# Patient Record
Sex: Female | Born: 1935 | Race: White | Hispanic: No | Smoking: Never smoker
Health system: Southern US, Community
[De-identification: ages and names within clinical notes are randomized; demographics above are authoritative.]

## PROBLEM LIST (undated history)

## (undated) DIAGNOSIS — C801 Malignant (primary) neoplasm, unspecified: Secondary | ICD-10-CM

## (undated) DIAGNOSIS — I10 Essential (primary) hypertension: Secondary | ICD-10-CM

## (undated) HISTORY — PX: ABDOMINAL HYSTERECTOMY: SHX81

## (undated) HISTORY — DX: Essential (primary) hypertension: I10

## (undated) HISTORY — PX: APPENDECTOMY: SHX54

---

## 2013-02-18 ENCOUNTER — Other Ambulatory Visit: Payer: Self-pay | Admitting: *Deleted

## 2013-02-18 MED ORDER — LORAZEPAM 0.5 MG PO TABS: 0.5000 mg | ORAL_TABLET | Freq: Two times a day (BID) | ORAL | Status: DC | PRN

## 2013-02-18 NOTE — Telephone Encounter (Signed)
Last filled 11/25/12, last seen by [facility] 8/13. If approved please have nurse call into pharmacy

## 2013-02-20 ENCOUNTER — Telehealth: Payer: Self-pay | Admitting: *Deleted

## 2013-02-20 NOTE — Telephone Encounter (Signed)
[facility] 

## 2013-05-30 ENCOUNTER — Other Ambulatory Visit: Payer: Self-pay | Admitting: Nurse Practitioner

## 2013-06-02 ENCOUNTER — Other Ambulatory Visit: Payer: Self-pay | Admitting: Nurse Practitioner

## 2013-06-02 NOTE — Telephone Encounter (Signed)
Please call in rx for lorazepam with 2 refills

## 2013-06-02 NOTE — Telephone Encounter (Signed)
Last filled 02/18/13 by you, not seen since 08/13 by [facility]

## 2013-06-02 NOTE — Telephone Encounter (Signed)
rx called into pharmacy

## 2013-06-20 ENCOUNTER — Ambulatory Visit (INDEPENDENT_AMBULATORY_CARE_PROVIDER_SITE_OTHER): Payer: Medicare Other | Admitting: Family Medicine

## 2013-06-20 ENCOUNTER — Telehealth: Payer: Self-pay | Admitting: Family Medicine

## 2013-06-20 ENCOUNTER — Encounter: Payer: Self-pay | Admitting: Family Medicine

## 2013-06-20 VITALS — BP 141/56 | HR 67 | Temp 97.8°F | Wt 120.4 lb

## 2013-06-20 DIAGNOSIS — F411 Generalized anxiety disorder: Secondary | ICD-10-CM

## 2013-06-20 DIAGNOSIS — I10 Essential (primary) hypertension: Secondary | ICD-10-CM

## 2013-06-20 DIAGNOSIS — H8110 Benign paroxysmal vertigo, unspecified ear: Secondary | ICD-10-CM

## 2013-06-20 MED ORDER — LISINOPRIL-HYDROCHLOROTHIAZIDE 20-12.5 MG PO TABS: 1.0000 | ORAL_TABLET | Freq: Every day | ORAL | Status: DC

## 2013-06-20 MED ORDER — MECLIZINE HCL 12.5 MG PO TABS: 12.5000 mg | ORAL_TABLET | Freq: Three times a day (TID) | ORAL | Status: DC | PRN

## 2013-06-20 MED ORDER — LORAZEPAM 0.5 MG PO TABS: 0.5000 mg | ORAL_TABLET | Freq: Four times a day (QID) | ORAL | Status: DC | PRN

## 2013-06-20 NOTE — Telephone Encounter (Signed)
Put in epic

## 2013-07-14 NOTE — Patient Instructions (Signed)

## 2013-07-14 NOTE — Progress Notes (Signed)
  Subjective:    Patient ID: Cassie Berg, female    DOB: 24-May-1918, 79 y.o.   MRN: [id number]  HPI This 79 y.o. female presents for evaluation of c/o anxiety.  She has hx of palpitations  Which she say are worse when she is anxious.  She would like refill on her lorazepam. She has been experiencing dizziness.   Review of Systems C/o anxiety No chest pain, SOB, HA, dizziness, vision change, N/V, diarrhea, constipation, dysuria, urinary urgency or frequency, myalgias, arthralgias or rash.     Objective:   Physical Exam Vital signs noted  Well developed well nourished female.  HEENT - Head atraumatic Normocephalic. Respiratory - Lungs CTA bilateral Cardiac - RRR S1 and S2 without murmur GI - Abdomen soft Nontender and bowel sounds active x 4 Extremities - No edema. Neuro - Grossly intact.       Assessment & Plan:  Essential hypertension, benign - Plan: lisinopril-hydrochlorothiazide (PRINZIDE,ZESTORETIC) 20-12.5 MG per tablet  Anxiety state, unspecified - Plan: LORazepam (ATIVAN) 0.5 MG tablet  Benign paroxysmal positional vertigo - Plan: meclizine (ANTIVERT) 12.5 MG tablet

## 2013-09-11 ENCOUNTER — Telehealth: Payer: Self-pay | Admitting: Family Medicine

## 2013-09-11 NOTE — Telephone Encounter (Signed)
Spoke with Morrie Sheldon regarding grandmother urinary symptoms and call transferred to [facility] for appt

## 2013-09-11 NOTE — Telephone Encounter (Signed)
Left message for pt to return call.

## 2013-09-12 ENCOUNTER — Ambulatory Visit (INDEPENDENT_AMBULATORY_CARE_PROVIDER_SITE_OTHER): Payer: Medicare Other | Admitting: Physician Assistant

## 2013-09-12 VITALS — BP 138/52 | HR 67 | Temp 98.3°F | Wt 118.0 lb

## 2013-09-12 DIAGNOSIS — R300 Dysuria: Secondary | ICD-10-CM

## 2013-09-12 LAB — POCT URINALYSIS DIPSTICK
Glucose, UA: NEGATIVE
Nitrite, UA: NEGATIVE
Urobilinogen, UA: NEGATIVE
pH, UA: 5

## 2013-09-12 LAB — POCT UA - MICROSCOPIC ONLY
Casts, Ur, LPF, POC: NEGATIVE
Mucus, UA: NEGATIVE
Yeast, UA: NEGATIVE

## 2013-09-12 MED ORDER — NITROFURANTOIN MONOHYD MACRO 100 MG PO CAPS: 100.0000 mg | ORAL_CAPSULE | Freq: Two times a day (BID) | ORAL | Status: DC

## 2013-09-12 NOTE — Progress Notes (Signed)
  Subjective:    Patient ID: Cassie Berg, female    DOB: [birth year], 79 y.o.   MRN: [id number]  HPI56 y/o female presents with her granddaughter for c/o 4 day history of burning with urination and frequency. Has used OTC Azo w/ mild relief.     Review of Systems  Constitutional: Negative.   HENT: Negative.   Gastrointestinal: Negative.   Genitourinary: Positive for dysuria, urgency and frequency. Negative for hematuria, flank pain, decreased urine volume, vaginal bleeding, vaginal discharge, enuresis, difficulty urinating, vaginal pain and pelvic pain.       Objective:   Physical Exam  Vitals reviewed. Constitutional: She appears well-developed and well-nourished. No distress.  Pulmonary/Chest: Effort normal and breath sounds normal.  Abdominal: Soft. Bowel sounds are normal. She exhibits no distension and no mass. There is no tenderness. There is no rebound and no guarding.  Skin: Skin is warm and dry. She is not diaphoretic.  Psychiatric: She has a normal mood and affect. Her behavior is normal. Judgment and thought content normal.          Assessment & Plan:  1. UTI: U/A positive for moderate WBC and bacteria: Prescribed Macrobid 100mg  BID x 5 days. Drink plenty of fluids. RTC if s/s worsen or do not improve with treatment.

## 2013-09-12 NOTE — Patient Instructions (Signed)
Urinary Tract Infection  Urinary tract infections (UTIs) can develop anywhere along your urinary tract. Your urinary tract is your body's drainage system for removing wastes and extra water. Your urinary tract includes two kidneys, two ureters, a bladder, and a urethra. Your kidneys are a pair of bean-shaped organs. Each kidney is about the size of your fist. They are located below your ribs, one on each side of your spine.  CAUSES  Infections are caused by microbes, which are microscopic organisms, including fungi, viruses, and bacteria. These organisms are so small that they can only be seen through a microscope. Bacteria are the microbes that most commonly cause UTIs.  SYMPTOMS   Symptoms of UTIs may vary by age and gender of the patient and by the location of the infection. Symptoms in young women typically include a frequent and intense urge to urinate and a painful, burning feeling in the bladder or urethra during urination. Older women and men are more likely to be tired, shaky, and weak and have muscle aches and abdominal pain. A fever may mean the infection is in your kidneys. Other symptoms of a kidney infection include pain in your back or sides below the ribs, nausea, and vomiting.  DIAGNOSIS  To diagnose a UTI, your caregiver will ask you about your symptoms. Your caregiver also will ask to provide a urine sample. The urine sample will be tested for bacteria and white blood cells. White blood cells are made by your body to help fight infection.  TREATMENT   Typically, UTIs can be treated with medication. Because most UTIs are caused by a bacterial infection, they usually can be treated with the use of antibiotics. The choice of antibiotic and length of treatment depend on your symptoms and the type of bacteria causing your infection.  HOME CARE INSTRUCTIONS   If you were prescribed antibiotics, take them exactly as your caregiver instructs you. Finish the medication even if you feel better after you  have only taken some of the medication.   Drink enough water and fluids to keep your urine clear or pale yellow.   Avoid caffeine, tea, and carbonated beverages. They tend to irritate your bladder.   Empty your bladder often. Avoid holding urine for long periods of time.   Empty your bladder before and after sexual intercourse.   After a bowel movement, women should cleanse from front to back. Use each tissue only once.  SEEK MEDICAL CARE IF:    You have back pain.   You develop a fever.   Your symptoms do not begin to resolve within 3 days.  SEEK IMMEDIATE MEDICAL CARE IF:    You have severe back pain or lower abdominal pain.   You develop chills.   You have nausea or vomiting.   You have continued burning or discomfort with urination.  MAKE SURE YOU:    Understand these instructions.   Will watch your condition.   Will get help right away if you are not doing well or get worse.  Document Released: 08/16/2005 Document Revised: 05/07/2012 Document Reviewed: 12/15/2011  Exit[employer] Patient Information 2014 [employer].

## 2013-09-25 ENCOUNTER — Other Ambulatory Visit: Payer: Self-pay

## 2014-01-09 ENCOUNTER — Other Ambulatory Visit: Payer: Self-pay | Admitting: *Deleted

## 2014-01-09 DIAGNOSIS — F411 Generalized anxiety disorder: Secondary | ICD-10-CM

## 2014-01-09 NOTE — Telephone Encounter (Signed)
Patient last seen in office on 09-12-13 for an acute visit. Last chronic follow up was on 06-20-13. Rx last filled on 11-29-13. Please advise. If approved please route to [facility] so [facility]

## 2014-01-12 MED ORDER — LORAZEPAM 0.5 MG PO TABS
0.5000 mg | ORAL_TABLET | Freq: Four times a day (QID) | ORAL | Status: DC | PRN
Start: ? — End: 2014-03-02

## 2014-01-12 NOTE — Telephone Encounter (Signed)
Called in.

## 2014-03-02 ENCOUNTER — Other Ambulatory Visit: Payer: Self-pay | Admitting: *Deleted

## 2014-03-02 DIAGNOSIS — F411 Generalized anxiety disorder: Secondary | ICD-10-CM

## 2014-03-02 NOTE — Telephone Encounter (Signed)
Patient last seen in office on 09-12-13 for an acute visit. Rx last filled on 01-12-14. Please advise. If approved please route to Pool A so nurse can phone in to pharmacy

## 2014-03-04 MED ORDER — LORAZEPAM 0.5 MG PO TABS: 0.5000 mg | ORAL_TABLET | Freq: Four times a day (QID) | ORAL | Status: DC | PRN

## 2014-03-05 NOTE — Telephone Encounter (Signed)
Left message on voicemail with refill information.

## 2014-05-04 ENCOUNTER — Other Ambulatory Visit: Payer: Self-pay | Admitting: Family Medicine

## 2014-05-08 ENCOUNTER — Telehealth: Payer: Self-pay | Admitting: Family Medicine

## 2014-05-08 NOTE — Telephone Encounter (Signed)
[facility] until her Aug[facility] appt

## 2014-05-08 NOTE — Telephone Encounter (Signed)
This is okay to refill 

## 2014-06-26 ENCOUNTER — Ambulatory Visit: Payer: PRIVATE HEALTH INSURANCE | Admitting: Family Medicine

## 2014-07-01 ENCOUNTER — Ambulatory Visit (INDEPENDENT_AMBULATORY_CARE_PROVIDER_SITE_OTHER): Payer: Medicare Other | Admitting: Family Medicine

## 2014-07-01 VITALS — BP 162/61 | HR 70 | Temp 97.6°F | Wt 119.4 lb

## 2014-07-01 DIAGNOSIS — F411 Generalized anxiety disorder: Secondary | ICD-10-CM

## 2014-07-01 DIAGNOSIS — I10 Essential (primary) hypertension: Secondary | ICD-10-CM

## 2014-07-01 MED ORDER — LORAZEPAM 0.5 MG PO TABS: 0.5000 mg | ORAL_TABLET | Freq: Four times a day (QID) | ORAL | Status: DC | PRN

## 2014-07-01 MED ORDER — LISINOPRIL-HYDROCHLOROTHIAZIDE 20-12.5 MG PO TABS: 1.0000 | ORAL_TABLET | Freq: Every day | ORAL | Status: DC

## 2014-07-01 NOTE — Progress Notes (Signed)
   Subjective:    Patient ID: Cassie Berg, female    DOB: 03-25-1918, 80 y.o.   MRN: [id number]  HPI  Patient is here for follow up and needs refills.  Review of Systems No chest pain, SOB, HA, dizziness, vision change, N/V, diarrhea, constipation, dysuria, urinary urgency or frequency, myalgias, arthralgias or rash.     Objective:   Physical Exam  Vital signs noted  Well developed well nourished elderly female.  HEENT - Head atraumatic Normocephalic                Eyes - PERRLA, Conjuctiva - clear Sclera- Clear EOMI                Ears - EAC's Wnl TM's Wnl Gross Hearing WNL                Nose - Nares patent                 Throat - oropharanx wnl Respiratory - Lungs CTA bilateral Cardiac - RRR S1 and S2 without murmur GI - Abdomen soft Nontender and bowel sounds active x 4 Extremities - No edema. Neuro - Grossly intact.      Assessment & Plan:  Essential hypertension, benign - Plan: lisinopril-hydrochlorothiazide (PRINZIDE,ZESTORETIC) 20-12.5 MG per tablet  Anxiety state, unspecified - Plan: LORazepam (ATIVAN) 0.5 MG tablet, DISCONTINUED: LORazepam (ATIVAN) 0.5 MG tablet  Lysbeth Penner FNP

## 2014-10-03 ENCOUNTER — Other Ambulatory Visit: Payer: Self-pay | Admitting: Family Medicine

## 2014-10-04 NOTE — Telephone Encounter (Signed)
Last filled 08/01/14, last seen 07/01/14. [facility]

## 2014-10-06 NOTE — Telephone Encounter (Signed)
Please advise 

## 2014-10-07 NOTE — Telephone Encounter (Signed)
[facility]

## 2014-11-11 ENCOUNTER — Other Ambulatory Visit: Payer: Self-pay | Admitting: Family Medicine

## 2014-11-12 NOTE — Telephone Encounter (Signed)
Rx okayed per Dr Sabra Heck

## 2014-11-12 NOTE — Telephone Encounter (Signed)
[facility]

## 2014-12-22 ENCOUNTER — Other Ambulatory Visit: Payer: Self-pay

## 2014-12-22 MED ORDER — LORAZEPAM 0.5 MG PO TABS: ORAL_TABLET | ORAL | Status: DC

## 2014-12-22 NOTE — Telephone Encounter (Signed)
Last seen 07/01/14 B [place] no upcoming appt.  If approved route to nurse [facility]

## 2014-12-23 NOTE — Telephone Encounter (Signed)
Refill call to pharmacy

## 2014-12-31 ENCOUNTER — Telehealth: Payer: Self-pay | Admitting: Family Medicine

## 2014-12-31 ENCOUNTER — Other Ambulatory Visit: Payer: Self-pay | Admitting: Family Medicine

## 2015-01-05 NOTE — Telephone Encounter (Signed)
Already filled

## 2015-02-27 ENCOUNTER — Other Ambulatory Visit: Payer: Self-pay | Admitting: Family Medicine

## 2015-03-01 NOTE — Telephone Encounter (Signed)
[place] pt, last filled 12/23/14, last seen 06/2014. 81 y.o. [facility]

## 2015-05-01 ENCOUNTER — Other Ambulatory Visit: Payer: Self-pay | Admitting: Family Medicine

## 2015-05-03 ENCOUNTER — Other Ambulatory Visit: Payer: Self-pay | Admitting: *Deleted

## 2015-05-03 MED ORDER — LORAZEPAM 0.5 MG PO TABS: 0.5000 mg | ORAL_TABLET | Freq: Four times a day (QID) | ORAL | Status: DC | PRN

## 2015-05-03 NOTE — Telephone Encounter (Signed)
Patient of Cassie Berg.  Rx for lorazepam was previously denied because she needed a follow up appt. Scheduled appt with you for 6/28 per patients request. Is it ok to go ahead with refill. Please advise and route to [facility]

## 2015-05-03 NOTE — Telephone Encounter (Signed)
[facility] 

## 2015-05-11 ENCOUNTER — Ambulatory Visit (INDEPENDENT_AMBULATORY_CARE_PROVIDER_SITE_OTHER): Payer: Medicare Other | Admitting: Physician Assistant

## 2015-05-11 ENCOUNTER — Encounter: Payer: Self-pay | Admitting: Physician Assistant

## 2015-05-11 VITALS — BP 136/56 | HR 74 | Temp 97.1°F | Wt 113.0 lb

## 2015-05-11 DIAGNOSIS — I10 Essential (primary) hypertension: Secondary | ICD-10-CM

## 2015-05-11 DIAGNOSIS — R42 Dizziness and giddiness: Secondary | ICD-10-CM

## 2015-05-11 DIAGNOSIS — G4700 Insomnia, unspecified: Secondary | ICD-10-CM | POA: Diagnosis not present

## 2015-05-11 LAB — POCT CBC
Granulocyte percent: 68.2 %G (ref 37–80)
HCT, POC: 37.8 % (ref 37.7–47.9)
HEMOGLOBIN: 11.7 g/dL — AB (ref 12.2–16.2)
LYMPH, POC: 1.1 (ref 0.6–3.4)
MCH, POC: 30.8 pg (ref 27–31.2)
MCHC: 30.8 g/dL — AB (ref 31.8–35.4)
MCV: 99.7 fL — AB (ref 80–97)
MPV: 6.5 fL (ref 0–99.8)
POC Granulocyte: 3.1 (ref 2–6.9)
POC LYMPH PERCENT: 25 %L (ref 10–50)
Platelet Count, POC: 233 10*3/uL (ref 142–424)
RBC: 3.79 M/uL — AB (ref 4.04–5.48)
RDW, POC: 13.1 %
WBC: 4.6 10*3/uL (ref 4.6–10.2)

## 2015-05-11 MED ORDER — LORAZEPAM 0.5 MG PO TABS: 0.5000 mg | ORAL_TABLET | Freq: Four times a day (QID) | ORAL | Status: DC | PRN

## 2015-05-11 MED ORDER — LISINOPRIL-HYDROCHLOROTHIAZIDE 20-12.5 MG PO TABS: 1.0000 | ORAL_TABLET | Freq: Every day | ORAL | Status: AC

## 2015-05-11 MED ORDER — MECLIZINE HCL 12.5 MG PO TABS: ORAL_TABLET | ORAL | Status: AC

## 2015-05-12 LAB — CMP14+EGFR
ALT: 13 IU/L (ref 0–32)
AST: 17 IU/L (ref 0–40)
Albumin/Globulin Ratio: 1.6 (ref 1.1–2.5)
Albumin: 4.1 g/dL (ref 3.2–4.6)
Alkaline Phosphatase: 83 IU/L (ref 39–117)
BUN/Creatinine Ratio: 22 (ref 11–26)
BUN: 24 mg/dL (ref 10–36)
Bilirubin Total: 0.3 mg/dL (ref 0.0–1.2)
CO2: 24 mmol/L (ref 18–29)
Calcium: 9.8 mg/dL (ref 8.7–10.3)
Chloride: 104 mmol/L (ref 97–108)
Creatinine, Ser: 1.07 mg/dL — ABNORMAL HIGH (ref 0.57–1.00)
GFR calc Af Amer: 51 mL/min/1.73 — ABNORMAL LOW
GFR calc non Af Amer: 45 mL/min/1.73 — ABNORMAL LOW
Globulin, Total: 2.5 g/dL (ref 1.5–4.5)
Glucose: 102 mg/dL — ABNORMAL HIGH (ref 65–99)
Potassium: 4.3 mmol/L (ref 3.5–5.2)
Sodium: 144 mmol/L (ref 134–144)
Total Protein: 6.6 g/dL (ref 6.0–8.5)

## 2015-05-15 NOTE — Progress Notes (Signed)
   Subjective:    Patient ID: Cassie Berg, female    DOB: November 12, 1918, 81 y.o.   MRN: [id number]  HPI 81 y/o female with h/o htn, anxiety presents for refills of her medication. She has not had labs in the past and does not wish to have them today     Review of Systems  Constitutional: Negative for fever, chills, appetite change, fatigue and unexpected weight change.  HENT: Negative.   Respiratory: Negative.   Cardiovascular: Negative.   Gastrointestinal: Negative.   Neurological: Positive for dizziness (occasional ).  Psychiatric/Behavioral: Positive for sleep disturbance.       Objective:   Physical Exam  Constitutional: She appears well-developed and well-nourished. No distress.  Very pleasant, petite  HENT:  Head: Normocephalic and atraumatic.  Cardiovascular: Normal rate, regular rhythm and normal heart sounds.  Exam reveals no gallop and no friction rub.   No murmur heard. Pulmonary/Chest: Effort normal and breath sounds normal. No respiratory distress. She has no wheezes. She has no rales. She exhibits no tenderness.  Musculoskeletal: She exhibits no edema.  Skin: She is not diaphoretic.  Psychiatric: She has a normal mood and affect. Her behavior is normal. Judgment and thought content normal.  Nursing note and vitals reviewed.         Assessment & Plan:  1. Essential hypertension, benign  - lisinopril-hydrochlorothiazide (PRINZIDE,ZESTORETIC) 20-12.5 MG per tablet; Take 1 tablet by mouth daily.  Dispense: 30 tablet; Refill: 11 - POCT CBC - CMP14+EGFR  2. Insomnia  - LORazepam (ATIVAN) 0.5 MG tablet; Take 1 tablet (0.5 mg total) by mouth every 6 (six) hours as needed. for anxiety  Dispense: 60 tablet; Refill: 5  3. Dizziness and giddiness  - meclizine (ANTIVERT) 12.5 MG tablet; TAKE 1 TABLET (12.5 MG TOTAL) BY MOUTH 3 (THREE) TIMES DAILY AS NEEDED.  Dispense: 30 tablet; Refill: 5   Continue all meds Labs pending Health Maintenance reviewed Diet and  exercise encouraged RTO 6 months   Bomani Oommen A. Benjamin Stain PA-C

## 2015-05-18 ENCOUNTER — Ambulatory Visit: Payer: Medicare Other | Admitting: Physician Assistant

## 2015-11-24 ENCOUNTER — Other Ambulatory Visit: Payer: Self-pay | Admitting: Physician Assistant

## 2015-11-25 NOTE — Telephone Encounter (Signed)
rx called into pharmacy

## 2015-11-25 NOTE — Telephone Encounter (Signed)
Please call in ativan with 1 refills 

## 2015-11-25 NOTE — Telephone Encounter (Signed)
Last seen 05/11/15  Tiffany  If approved route [facility]

## 2016-04-04 ENCOUNTER — Encounter: Payer: Self-pay | Admitting: *Deleted

## 2016-04-15 ENCOUNTER — Other Ambulatory Visit: Payer: Self-pay | Admitting: Nurse Practitioner

## 2016-04-18 ENCOUNTER — Emergency Department (HOSPITAL_COMMUNITY): Payer: Medicare Other

## 2016-04-18 ENCOUNTER — Inpatient Hospital Stay (HOSPITAL_COMMUNITY): Payer: Medicare Other

## 2016-04-18 ENCOUNTER — Inpatient Hospital Stay (HOSPITAL_COMMUNITY)
Admission: EM | Admit: 2016-04-18 | Discharge: 2016-04-21 | DRG: 470 | Disposition: A | Discharge status: Skilled Nursing Facility | Payer: Medicare Other | Attending: Internal Medicine | Admitting: Internal Medicine

## 2016-04-18 ENCOUNTER — Inpatient Hospital Stay (HOSPITAL_COMMUNITY): Payer: Medicare Other | Admitting: Anesthesiology

## 2016-04-18 ENCOUNTER — Encounter (HOSPITAL_COMMUNITY)
Admission: EM | Disposition: A | Discharge status: Skilled Nursing Facility | Payer: Self-pay | Source: Home / Self Care | Attending: Internal Medicine

## 2016-04-18 ENCOUNTER — Encounter (HOSPITAL_COMMUNITY): Payer: Self-pay | Admitting: Emergency Medicine

## 2016-04-18 DIAGNOSIS — Z66 Do not resuscitate: Secondary | ICD-10-CM | POA: Diagnosis present

## 2016-04-18 DIAGNOSIS — Z8542 Personal history of malignant neoplasm of other parts of uterus: Secondary | ICD-10-CM

## 2016-04-18 DIAGNOSIS — I10 Essential (primary) hypertension: Secondary | ICD-10-CM | POA: Diagnosis present

## 2016-04-18 DIAGNOSIS — Z90710 Acquired absence of both cervix and uterus: Secondary | ICD-10-CM

## 2016-04-18 DIAGNOSIS — W010XXA Fall on same level from slipping, tripping and stumbling without subsequent striking against object, initial encounter: Secondary | ICD-10-CM | POA: Diagnosis present

## 2016-04-18 DIAGNOSIS — E860 Dehydration: Secondary | ICD-10-CM | POA: Diagnosis present

## 2016-04-18 DIAGNOSIS — H9190 Unspecified hearing loss, unspecified ear: Secondary | ICD-10-CM | POA: Diagnosis present

## 2016-04-18 DIAGNOSIS — S72011A Unspecified intracapsular fracture of right femur, initial encounter for closed fracture: Principal | ICD-10-CM | POA: Diagnosis present

## 2016-04-18 DIAGNOSIS — Z7982 Long term (current) use of aspirin: Secondary | ICD-10-CM | POA: Diagnosis not present

## 2016-04-18 DIAGNOSIS — Z96649 Presence of unspecified artificial hip joint: Secondary | ICD-10-CM

## 2016-04-18 DIAGNOSIS — S72001S Fracture of unspecified part of neck of right femur, sequela: Secondary | ICD-10-CM | POA: Diagnosis not present

## 2016-04-18 DIAGNOSIS — M79604 Pain in right leg: Secondary | ICD-10-CM | POA: Diagnosis present

## 2016-04-18 DIAGNOSIS — W19XXXS Unspecified fall, sequela: Secondary | ICD-10-CM | POA: Diagnosis not present

## 2016-04-18 DIAGNOSIS — F411 Generalized anxiety disorder: Secondary | ICD-10-CM | POA: Diagnosis present

## 2016-04-18 DIAGNOSIS — D62 Acute posthemorrhagic anemia: Secondary | ICD-10-CM | POA: Diagnosis not present

## 2016-04-18 DIAGNOSIS — S83011A Lateral subluxation of right patella, initial encounter: Secondary | ICD-10-CM | POA: Diagnosis present

## 2016-04-18 DIAGNOSIS — S72001A Fracture of unspecified part of neck of right femur, initial encounter for closed fracture: Secondary | ICD-10-CM | POA: Diagnosis present

## 2016-04-18 DIAGNOSIS — Z419 Encounter for procedure for purposes other than remedying health state, unspecified: Secondary | ICD-10-CM

## 2016-04-18 DIAGNOSIS — Y92000 Kitchen of unspecified non-institutional (private) residence as  the place of occurrence of the external cause: Secondary | ICD-10-CM | POA: Diagnosis not present

## 2016-04-18 DIAGNOSIS — Z79899 Other long term (current) drug therapy: Secondary | ICD-10-CM | POA: Diagnosis not present

## 2016-04-18 DIAGNOSIS — N179 Acute kidney failure, unspecified: Secondary | ICD-10-CM | POA: Diagnosis present

## 2016-04-18 DIAGNOSIS — N289 Disorder of kidney and ureter, unspecified: Secondary | ICD-10-CM | POA: Diagnosis not present

## 2016-04-18 HISTORY — DX: Malignant (primary) neoplasm, unspecified: C80.1

## 2016-04-18 HISTORY — PX: ANTERIOR APPROACH HEMI HIP ARTHROPLASTY: SHX6690

## 2016-04-18 LAB — CBC WITH DIFFERENTIAL/PLATELET
BASOS ABS: 0 10*3/uL (ref 0.0–0.1)
BASOS PCT: 0 %
EOS ABS: 0 10*3/uL (ref 0.0–0.7)
EOS PCT: 0 %
HCT: 38.4 % (ref 36.0–46.0)
Hemoglobin: 12.6 g/dL (ref 12.0–15.0)
Lymphocytes Relative: 9 %
Lymphs Abs: 0.7 10*3/uL (ref 0.7–4.0)
MCH: 32.7 pg (ref 26.0–34.0)
MCHC: 32.8 g/dL (ref 30.0–36.0)
MCV: 99.7 fL (ref 78.0–100.0)
MONO ABS: 0.4 10*3/uL (ref 0.1–1.0)
MONOS PCT: 5 %
NEUTROS ABS: 7 10*3/uL (ref 1.7–7.7)
Neutrophils Relative %: 86 %
PLATELETS: 185 10*3/uL (ref 150–400)
RBC: 3.85 MIL/uL — ABNORMAL LOW (ref 3.87–5.11)
RDW: 12.9 % (ref 11.5–15.5)
WBC: 8.2 10*3/uL (ref 4.0–10.5)

## 2016-04-18 LAB — BASIC METABOLIC PANEL
Anion gap: 10 (ref 5–15)
BUN: 27 mg/dL — ABNORMAL HIGH (ref 6–20)
CALCIUM: 9.7 mg/dL (ref 8.9–10.3)
CO2: 27 mmol/L (ref 22–32)
CREATININE: 1.19 mg/dL — AB (ref 0.44–1.00)
Chloride: 103 mmol/L (ref 101–111)
GFR, EST AFRICAN AMERICAN: 44 mL/min — AB (ref >60)
GFR, EST NON AFRICAN AMERICAN: 38 mL/min — AB (ref >60)
Glucose, Bld: 113 mg/dL — ABNORMAL HIGH (ref 65–99)
Potassium: 3.8 mmol/L (ref 3.5–5.1)
SODIUM: 140 mmol/L (ref 135–145)

## 2016-04-18 LAB — SURGICAL PCR SCREEN
MRSA, PCR: NEGATIVE
Staphylococcus aureus: NEGATIVE

## 2016-04-18 LAB — PROTIME-INR
INR: 0.94 (ref 0.00–1.49)
Prothrombin Time: 12.8 seconds (ref 11.6–15.2)

## 2016-04-18 SURGERY — HEMIARTHROPLASTY, HIP, DIRECT ANTERIOR APPROACH, FOR FRACTURE
Anesthesia: Spinal | Site: Hip | Laterality: Right

## 2016-04-18 MED ORDER — HYDROMORPHONE HCL 1 MG/ML IJ SOLN: 0.2500 mg | INTRAMUSCULAR | Status: DC | PRN

## 2016-04-18 MED ORDER — ASPIRIN 81 MG PO CHEW
81.0000 mg | CHEWABLE_TABLET | Freq: Every day | ORAL | Status: DC
Start: 2016-04-19 — End: 2016-04-18

## 2016-04-18 MED ORDER — METOCLOPRAMIDE HCL 5 MG/ML IJ SOLN: 5.0000 mg | Freq: Three times a day (TID) | INTRAMUSCULAR | Status: DC | PRN

## 2016-04-18 MED ORDER — ONDANSETRON HCL 4 MG/2ML IJ SOLN: 4.0000 mg | Freq: Once | INTRAMUSCULAR | Status: DC | PRN

## 2016-04-18 MED ORDER — 0.9 % SODIUM CHLORIDE (POUR BTL) OPTIME
TOPICAL | Status: DC | PRN
  Administered 2016-04-18: 1000 mL

## 2016-04-18 MED ORDER — HYDROMORPHONE HCL 1 MG/ML IJ SOLN
0.2500 mg | INTRAMUSCULAR | Status: DC | PRN
  Administered 2016-04-18: 0.25 mg via INTRAVENOUS
  Filled 2016-04-18: qty 1

## 2016-04-18 MED ORDER — LACTATED RINGERS IV SOLN
INTRAVENOUS | Status: DC
  Administered 2016-04-18: 1000 mL via INTRAVENOUS

## 2016-04-18 MED ORDER — METHOCARBAMOL 500 MG PO TABS
500.0000 mg | ORAL_TABLET | Freq: Four times a day (QID) | ORAL | Status: DC | PRN
  Administered 2016-04-20: 500 mg via ORAL
  Filled 2016-04-18: qty 1

## 2016-04-18 MED ORDER — SODIUM CHLORIDE 0.9 % IV SOLN: INTRAVENOUS | Status: DC

## 2016-04-18 MED ORDER — ONDANSETRON HCL 4 MG/2ML IJ SOLN
INTRAMUSCULAR | Status: AC
  Filled 2016-04-18: qty 2

## 2016-04-18 MED ORDER — ONDANSETRON HCL 4 MG/2ML IJ SOLN
INTRAMUSCULAR | Status: DC | PRN
  Administered 2016-04-18: 4 mg via INTRAVENOUS

## 2016-04-18 MED ORDER — HYDROCODONE-ACETAMINOPHEN 5-325 MG PO TABS: 1.0000 | ORAL_TABLET | Freq: Four times a day (QID) | ORAL | Status: DC | PRN

## 2016-04-18 MED ORDER — PROPOFOL 10 MG/ML IV BOLUS
INTRAVENOUS | Status: AC
Start: 2016-04-18 — End: 2016-04-18
  Filled 2016-04-18: qty 40

## 2016-04-18 MED ORDER — POVIDONE-IODINE 10 % EX SWAB
2.0000 "application " | Freq: Once | CUTANEOUS | Status: AC
  Administered 2016-04-18: 2 via TOPICAL

## 2016-04-18 MED ORDER — HYDROCHLOROTHIAZIDE 12.5 MG PO CAPS
12.5000 mg | ORAL_CAPSULE | Freq: Every day | ORAL | Status: DC
  Administered 2016-04-20: 12.5 mg via ORAL
  Filled 2016-04-18 (×3): qty 1

## 2016-04-18 MED ORDER — ADULT MULTIVITAMIN W/MINERALS CH
1.0000 | ORAL_TABLET | Freq: Every day | ORAL | Status: DC
  Administered 2016-04-19 – 2016-04-21 (×3): 1 via ORAL
  Filled 2016-04-18 (×3): qty 1

## 2016-04-18 MED ORDER — PHENYLEPHRINE HCL 10 MG/ML IJ SOLN
INTRAMUSCULAR | Status: DC | PRN
  Administered 2016-04-18: 40 ug via INTRAVENOUS
  Administered 2016-04-18 (×2): 80 ug via INTRAVENOUS
  Administered 2016-04-18 (×3): 40 ug via INTRAVENOUS

## 2016-04-18 MED ORDER — CLINDAMYCIN PHOSPHATE 900 MG/50ML IV SOLN
INTRAVENOUS | Status: AC
  Filled 2016-04-18: qty 50

## 2016-04-18 MED ORDER — SODIUM CHLORIDE 0.9 % IR SOLN
Status: DC | PRN
  Administered 2016-04-18: 3000 mL

## 2016-04-18 MED ORDER — ONDANSETRON HCL 4 MG/2ML IJ SOLN: 4.0000 mg | Freq: Four times a day (QID) | INTRAMUSCULAR | Status: DC | PRN

## 2016-04-18 MED ORDER — LACTATED RINGERS IV SOLN
INTRAVENOUS | Status: DC | PRN
  Administered 2016-04-18: 19:00:00 via INTRAVENOUS

## 2016-04-18 MED ORDER — MENTHOL 3 MG MT LOZG: 1.0000 | LOZENGE | OROMUCOSAL | Status: DC | PRN

## 2016-04-18 MED ORDER — CHLORHEXIDINE GLUCONATE 4 % EX LIQD: 60.0000 mL | Freq: Once | CUTANEOUS | Status: DC

## 2016-04-18 MED ORDER — PROPOFOL 500 MG/50ML IV EMUL
INTRAVENOUS | Status: DC | PRN
  Administered 2016-04-18: 25 ug/kg/min via INTRAVENOUS

## 2016-04-18 MED ORDER — LORAZEPAM 0.5 MG PO TABS
0.5000 mg | ORAL_TABLET | Freq: Four times a day (QID) | ORAL | Status: DC | PRN
  Administered 2016-04-20: 0.5 mg via ORAL
  Filled 2016-04-18: qty 1

## 2016-04-18 MED ORDER — BISMUTH SUBSALICYLATE 262 MG PO CHEW
524.0000 mg | CHEWABLE_TABLET | Freq: Every day | ORAL | Status: DC | PRN
  Filled 2016-04-18: qty 2

## 2016-04-18 MED ORDER — ONDANSETRON HCL 4 MG/2ML IJ SOLN
4.0000 mg | Freq: Four times a day (QID) | INTRAMUSCULAR | Status: DC | PRN
  Administered 2016-04-18: 4 mg via INTRAVENOUS
  Filled 2016-04-18: qty 2

## 2016-04-18 MED ORDER — DOCUSATE SODIUM 100 MG PO CAPS: 100.0000 mg | ORAL_CAPSULE | Freq: Two times a day (BID) | ORAL | Status: DC

## 2016-04-18 MED ORDER — FENTANYL CITRATE (PF) 100 MCG/2ML IJ SOLN
INTRAMUSCULAR | Status: AC
  Filled 2016-04-18: qty 2

## 2016-04-18 MED ORDER — BUPIVACAINE IN DEXTROSE 0.75-8.25 % IT SOLN
INTRATHECAL | Status: DC | PRN
  Administered 2016-04-18: 1.8 mL via INTRATHECAL

## 2016-04-18 MED ORDER — LISINOPRIL-HYDROCHLOROTHIAZIDE 20-12.5 MG PO TABS: 1.0000 | ORAL_TABLET | Freq: Every day | ORAL | Status: DC

## 2016-04-18 MED ORDER — PROPOFOL 10 MG/ML IV BOLUS
INTRAVENOUS | Status: DC | PRN
  Administered 2016-04-18: 20 mg via INTRAVENOUS
  Administered 2016-04-18: 10 mg via INTRAVENOUS

## 2016-04-18 MED ORDER — LISINOPRIL 20 MG PO TABS
20.0000 mg | ORAL_TABLET | Freq: Every day | ORAL | Status: DC
  Administered 2016-04-20: 20 mg via ORAL
  Filled 2016-04-18 (×3): qty 1

## 2016-04-18 MED ORDER — PHENOL 1.4 % MT LIQD
1.0000 | OROMUCOSAL | Status: DC | PRN
  Filled 2016-04-18: qty 177

## 2016-04-18 MED ORDER — DEXAMETHASONE SODIUM PHOSPHATE 10 MG/ML IJ SOLN
INTRAMUSCULAR | Status: DC | PRN
  Administered 2016-04-18: 10 mg via INTRAVENOUS

## 2016-04-18 MED ORDER — MECLIZINE HCL 12.5 MG PO TABS
12.5000 mg | ORAL_TABLET | Freq: Three times a day (TID) | ORAL | Status: DC | PRN
  Filled 2016-04-18: qty 1

## 2016-04-18 MED ORDER — MORPHINE SULFATE (PF) 2 MG/ML IV SOLN: 0.5000 mg | INTRAVENOUS | Status: DC | PRN

## 2016-04-18 MED ORDER — CLINDAMYCIN PHOSPHATE 600 MG/50ML IV SOLN
600.0000 mg | Freq: Four times a day (QID) | INTRAVENOUS | Status: AC
  Administered 2016-04-19 (×2): 600 mg via INTRAVENOUS
  Filled 2016-04-18 (×2): qty 50

## 2016-04-18 MED ORDER — FENTANYL CITRATE (PF) 100 MCG/2ML IJ SOLN
INTRAMUSCULAR | Status: DC | PRN
  Administered 2016-04-18 (×2): 25 ug via INTRAVENOUS

## 2016-04-18 MED ORDER — ACETAMINOPHEN 650 MG RE SUPP: 650.0000 mg | Freq: Four times a day (QID) | RECTAL | Status: DC | PRN

## 2016-04-18 MED ORDER — CLINDAMYCIN PHOSPHATE 900 MG/50ML IV SOLN
900.0000 mg | INTRAVENOUS | Status: AC
  Administered 2016-04-18: 900 mg via INTRAVENOUS

## 2016-04-18 MED ORDER — ENOXAPARIN SODIUM 30 MG/0.3ML ~~LOC~~ SOLN
30.0000 mg | Freq: Every day | SUBCUTANEOUS | Status: DC
  Filled 2016-04-18: qty 0.3

## 2016-04-18 MED ORDER — ACETAMINOPHEN 325 MG PO TABS
650.0000 mg | ORAL_TABLET | Freq: Four times a day (QID) | ORAL | Status: DC | PRN
  Administered 2016-04-19 – 2016-04-21 (×5): 650 mg via ORAL
  Filled 2016-04-18 (×5): qty 2

## 2016-04-18 MED ORDER — FAMOTIDINE 20 MG PO TABS
20.0000 mg | ORAL_TABLET | Freq: Every day | ORAL | Status: DC
  Administered 2016-04-19 – 2016-04-21 (×3): 20 mg via ORAL
  Filled 2016-04-18 (×3): qty 1

## 2016-04-18 MED ORDER — METHOCARBAMOL 1000 MG/10ML IJ SOLN
500.0000 mg | Freq: Four times a day (QID) | INTRAVENOUS | Status: DC | PRN
  Filled 2016-04-18: qty 5

## 2016-04-18 MED ORDER — PHENYLEPHRINE HCL 10 MG/ML IJ SOLN
10.0000 mg | INTRAMUSCULAR | Status: DC | PRN
  Administered 2016-04-18: 20 ug/min via INTRAVENOUS

## 2016-04-18 MED ORDER — DOCUSATE SODIUM 100 MG PO CAPS
100.0000 mg | ORAL_CAPSULE | Freq: Two times a day (BID) | ORAL | Status: DC
  Administered 2016-04-18 – 2016-04-20 (×4): 100 mg via ORAL
  Filled 2016-04-18 (×6): qty 1

## 2016-04-18 MED ORDER — BUPIVACAINE HCL (PF) 0.75 % IJ SOLN: INTRAMUSCULAR | Status: DC | PRN

## 2016-04-18 MED ORDER — SODIUM CHLORIDE 0.9 % IV SOLN
INTRAVENOUS | Status: DC
  Administered 2016-04-19: 75 mL/h via INTRAVENOUS

## 2016-04-18 MED ORDER — DEXAMETHASONE SODIUM PHOSPHATE 10 MG/ML IJ SOLN
INTRAMUSCULAR | Status: AC
  Filled 2016-04-18: qty 1

## 2016-04-18 MED ORDER — METOCLOPRAMIDE HCL 10 MG PO TABS: 5.0000 mg | ORAL_TABLET | Freq: Three times a day (TID) | ORAL | Status: DC | PRN

## 2016-04-18 MED ORDER — ASPIRIN EC 325 MG PO TBEC
325.0000 mg | DELAYED_RELEASE_TABLET | Freq: Two times a day (BID) | ORAL | Status: DC
Start: 2016-04-19 — End: 2016-04-20
  Administered 2016-04-19 – 2016-04-20 (×3): 325 mg via ORAL
  Filled 2016-04-18 (×5): qty 1

## 2016-04-18 MED ORDER — ONDANSETRON HCL 4 MG PO TABS: 4.0000 mg | ORAL_TABLET | Freq: Four times a day (QID) | ORAL | Status: DC | PRN

## 2016-04-18 SURGICAL SUPPLY — 67 items
BAG ZIPLOCK 12X15 (MISCELLANEOUS) ×3 IMPLANT
BLADE EXTENDED COATED 6.5IN (ELECTRODE) ×3 IMPLANT
BLADE HEX COATED 2.75 (ELECTRODE) ×3 IMPLANT
BLADE SAG 18X100X1.27 (BLADE) ×3 IMPLANT
BLADE SAW SAG 73X25 THK (BLADE)
BLADE SAW SGTL 18X1.27X75 (BLADE) ×2 IMPLANT
BLADE SAW SGTL 18X1.27X75MM (BLADE) ×1
BLADE SAW SGTL 73X25 THK (BLADE) IMPLANT
BLADE SURG SZ10 CARB STEEL (BLADE) ×6 IMPLANT
BRUSH FEMORAL CANAL (MISCELLANEOUS) IMPLANT
CAPT HIP HEMI 2 ×3 IMPLANT
CELLS DAT CNTRL 66122 CELL SVR (MISCELLANEOUS) ×1 IMPLANT
CLOTH BEACON ORANGE TIMEOUT ST (SAFETY) ×3 IMPLANT
COVER PERINEAL POST (MISCELLANEOUS) ×3 IMPLANT
DRAPE C-ARM 42X120 X-RAY (DRAPES) ×3 IMPLANT
DRAPE HIP W/POCKET STRL (DRAPE) IMPLANT
DRAPE INCISE IOBAN 66X45 STRL (DRAPES) ×3 IMPLANT
DRAPE POUCH INSTRU U-SHP 10X18 (DRAPES) ×3 IMPLANT
DRAPE STERI IOBAN 125X83 (DRAPES) ×3 IMPLANT
DRAPE U-SHAPE 47X51 STRL (DRAPES) ×9 IMPLANT
DRSG AQUACEL AG ADV 3.5X10 (GAUZE/BANDAGES/DRESSINGS) ×3 IMPLANT
DRSG MEPILEX BORDER 4X12 (GAUZE/BANDAGES/DRESSINGS) IMPLANT
DRSG MEPILEX BORDER 4X8 (GAUZE/BANDAGES/DRESSINGS) IMPLANT
DRSG PAD ABDOMINAL 8X10 ST (GAUZE/BANDAGES/DRESSINGS) IMPLANT
DURAPREP 26ML APPLICATOR (WOUND CARE) ×3 IMPLANT
ELECT BLADE TIP CTD 4 INCH (ELECTRODE) ×3 IMPLANT
ELECT PENCIL ROCKER SW 15FT (MISCELLANEOUS) IMPLANT
ELECT REM PT RETURN 9FT ADLT (ELECTROSURGICAL) ×3
ELECTRODE REM PT RTRN 9FT ADLT (ELECTROSURGICAL) ×1 IMPLANT
EVACUATOR 1/8 PVC DRAIN (DRAIN) IMPLANT
FACESHIELD WRAPAROUND (MASK) ×15 IMPLANT
GAUZE SPONGE 4X4 12PLY STRL (GAUZE/BANDAGES/DRESSINGS) IMPLANT
GLOVE BIO SURGEON STRL SZ7.5 (GLOVE) ×6 IMPLANT
GLOVE BIOGEL PI IND STRL 8 (GLOVE) ×2 IMPLANT
GLOVE BIOGEL PI INDICATOR 8 (GLOVE) ×4
GLOVE ECLIPSE 8.0 STRL XLNG CF (GLOVE) ×6 IMPLANT
GOWN STRL REUS W/TWL XL LVL3 (GOWN DISPOSABLE) ×9 IMPLANT
HANDPIECE INTERPULSE COAX TIP (DISPOSABLE) ×2
IMMOBILIZER KNEE 20 (SOFTGOODS)
IMMOBILIZER KNEE 20 THIGH 36 (SOFTGOODS) IMPLANT
LIQUID BAND (GAUZE/BANDAGES/DRESSINGS) IMPLANT
MARKER SKIN DUAL TIP RULER LAB (MISCELLANEOUS) ×3 IMPLANT
NEEDLE MA TROC 1/2 (NEEDLE) IMPLANT
PACK ANTERIOR HIP CUSTOM (KITS) ×3 IMPLANT
PACK TOTAL JOINT (CUSTOM PROCEDURE TRAY) IMPLANT
PACK UNIVERSAL I (CUSTOM PROCEDURE TRAY) IMPLANT
PASSER SUT SWANSON 36MM LOOP (INSTRUMENTS) IMPLANT
POSITIONER SURGICAL ARM (MISCELLANEOUS) IMPLANT
RTRCTR WOUND ALEXIS 18CM MED (MISCELLANEOUS) ×3
SET HNDPC FAN SPRY TIP SCT (DISPOSABLE) ×1 IMPLANT
SLEEVE SURGEON STRL (DRAPES) ×3 IMPLANT
SPONGE LAP 18X18 X RAY DECT (DISPOSABLE) IMPLANT
SPONGE LAP 4X18 X RAY DECT (DISPOSABLE) IMPLANT
STAPLER VISISTAT 35W (STAPLE) ×3 IMPLANT
SUT ETHIBOND NAB CT1 #1 30IN (SUTURE) ×3 IMPLANT
SUT FIBERWIRE #2 38 T-5 BLUE (SUTURE)
SUT MNCRL AB 4-0 PS2 18 (SUTURE) ×3 IMPLANT
SUT VIC AB 0 CT1 36 (SUTURE) ×6 IMPLANT
SUT VIC AB 1 CT1 36 (SUTURE) ×3 IMPLANT
SUT VIC AB 2-0 CT1 27 (SUTURE) ×2
SUT VIC AB 2-0 CT1 TAPERPNT 27 (SUTURE) ×1 IMPLANT
SUTURE FIBERWR #2 38 T-5 BLUE (SUTURE) IMPLANT
TOWEL OR 17X26 10 PK STRL BLUE (TOWEL DISPOSABLE) ×6 IMPLANT
TOWER CARTRIDGE SMART MIX (DISPOSABLE) IMPLANT
TRAY FOLEY W/METER SILVER 14FR (SET/KITS/TRAYS/PACK) ×3 IMPLANT
TRAY FOLEY W/METER SILVER 16FR (SET/KITS/TRAYS/PACK) IMPLANT
YANKAUER SUCT BULB TIP 10FT TU (MISCELLANEOUS) ×3 IMPLANT

## 2016-04-18 NOTE — ED Provider Notes (Signed)
CSN: [id number]     Arrival date & time 04/18/16  1540 History   First MD Initiated Contact with Patient 04/18/16 1604     Chief Complaint  Patient presents with  . Fall  . Leg Pain     (Consider location/radiation/quality/duration/timing/severity/associated sxs/prior Treatment) HPI Comments: 82 year old female with history of hypertension presents for leg pain. The patient states that she was trying to get ice cream out of her freezer but the door was stuck. She said she had to pull extremely hard and accidentally opened the door to the refrigerator side as well as the freezer this caused her to fall backwards onto her buttocks.  Patient states that she has had pain in her right upper leg ever since. She denies hitting her head or loss of consciousness. She is only on aspirin and no anticoagulants. Patient has not eaten since about 9 AM this morning.   Past Medical History  Diagnosis Date  . Hypertension   . Cancer [facility])     age 73 - uterine cancer with hysterectomy and "cobalt"   Past Surgical History  Procedure Laterality Date  . Abdominal hysterectomy    . Appendectomy     History reviewed. No pertinent family history. Social History  Substance Use Topics  . Smoking status: Never Smoker   . Smokeless tobacco: Former Systems developer    Types: Snuff     Comment: 04/18/16  - quit over 25 to 30 years ago per pt - was occasional use  . Alcohol Use: No   OB History    No data available     Review of Systems  Constitutional: Negative for fever, chills and fatigue.  HENT: Negative for congestion, postnasal drip, rhinorrhea and sinus pressure.   Eyes: Negative for visual disturbance.  Respiratory: Negative for cough, chest tightness and shortness of breath.   Cardiovascular: Negative for chest pain and palpitations.  Gastrointestinal: Negative for nausea, vomiting, abdominal pain and diarrhea.  Genitourinary: Negative for dysuria, urgency and frequency.  Musculoskeletal: Positive for  arthralgias (right hip pain). Negative for myalgias, back pain, neck pain and neck stiffness.  Skin: Positive for wound. Negative for rash.  Neurological: Negative for dizziness, seizures, weakness, numbness and headaches.  Hematological: Does not bruise/bleed easily.      Allergies  Cephalexin  Home Medications   Prior to Admission medications   Medication Sig Start Date End Date Taking? Authorizing Provider  aspirin 81 MG chewable tablet Chew 81 mg by mouth daily.   Yes Historical Provider, MD  bismuth subsalicylate (PEPTO BISMOL) 262 MG chewable tablet Chew 524 mg by mouth daily as needed for indigestion.   Yes Historical Provider, MD  lisinopril-hydrochlorothiazide (PRINZIDE,ZESTORETIC) 20-12.5 MG per tablet Take 1 tablet by mouth daily. 05/11/15  Yes Tiffany A Gann, PA-C  LORazepam (ATIVAN) 0.5 MG tablet TAKE 1 TABLET EVERY 6 HOURS AS NEEDED 04/18/16  Yes Mary-Margaret Hassell Done, FNP  meclizine (ANTIVERT) 12.5 MG tablet TAKE 1 TABLET (12.5 MG TOTAL) BY MOUTH 3 (THREE) TIMES DAILY AS NEEDED. 05/11/15  Yes Tiffany A Gann, PA-C  Multiple Vitamins-Minerals (MULTIVITAMIN WITH MINERALS) tablet Take 1 tablet by mouth daily.   Yes Historical Provider, MD  ranitidine (ZANTAC) 150 MG tablet Take 150 mg by mouth daily as needed for heartburn.   Yes Historical Provider, MD   BP 145/54 mmHg  Pulse 68  Temp(Src) 97.7 F (36.5 C) (Oral)  Resp 16  SpO2 99% Physical Exam  Constitutional: She is oriented to person, place, and time. She appears well-developed  and well-nourished. No distress.  HENT:  Head: Normocephalic and atraumatic.  Right Ear: External ear normal.  Left Ear: External ear normal.  Nose: Nose normal.  Mouth/Throat: Oropharynx is clear and moist. No oropharyngeal exudate.  Eyes: EOM are normal. Pupils are equal, round, and reactive to light.  Neck: Normal range of motion. Neck supple.  Cardiovascular: Normal rate, regular rhythm, normal heart sounds and intact distal pulses.   No  murmur heard. Pulses:      Dorsalis pedis pulses are 2+ on the right side, and 2+ on the left side.  Pulmonary/Chest: Effort normal. No respiratory distress. She has no wheezes. She has no rales.  Abdominal: Soft. She exhibits no distension. There is no tenderness.  Musculoskeletal: She exhibits no edema.       Right hip: She exhibits decreased range of motion, decreased strength, tenderness and bony tenderness. She exhibits no swelling, no crepitus, no deformity and no laceration.       Left hip: Normal.       Right knee: Normal.       Lumbar back: Normal.       Right upper leg: She exhibits tenderness and bony tenderness. She exhibits no swelling, no edema, no deformity and no laceration.  Neurological: She is alert and oriented to person, place, and time. No sensory deficit. She exhibits normal muscle tone.  Skin: Skin is warm and dry. No rash noted. She is not diaphoretic.  Vitals reviewed.   ED Course  Procedures (including critical care time) Labs Review Labs Reviewed  CBC WITH DIFFERENTIAL/PLATELET - Abnormal; Notable for the following:    RBC 3.85 (*)    All other components within normal limits  BASIC METABOLIC PANEL - Abnormal; Notable for the following:    Glucose, Bld 113 (*)    BUN 27 (*)    Creatinine, Ser 1.19 (*)    GFR calc non Af Amer 38 (*)    GFR calc Af Amer 44 (*)    All other components within normal limits  PROTIME-INR    Imaging Review Dg Chest 1 View  04/18/2016  CLINICAL DATA:  Pain following fall EXAM: CHEST 1 VIEW COMPARISON:  None. FINDINGS: There is no edema or consolidation. Heart is upper normal in size with pulmonary vascularity within normal limits. There is a skin fold on the left but no apparent pneumothorax. No adenopathy. There is atherosclerotic calcification in the aorta. Postoperative changes noted in the right upper arm region. IMPRESSION: No edema or consolidation. Apparent skin fold on the left without apparent pneumothorax.  Electronically Signed   By: Lowella Grip III M.D.   On: 04/18/2016 16:35   Dg Hip Unilat  With Pelvis 2-3 Views Right  04/18/2016  CLINICAL DATA:  Right hip pain after fall at home. Initial encounter. EXAM: DG HIP (WITH OR WITHOUT PELVIS) 2-3V RIGHT COMPARISON:  None. FINDINGS: Moderately displaced proximal right femoral neck fracture is noted. This appears to be closed and posttraumatic. No other fracture is noted. Vascular calcifications are noted. IMPRESSION: Moderately displaced proximal right femoral neck fracture. Electronically Signed   By: Marijo Conception, M.D.   On: 04/18/2016 16:32   Dg Femur 1v Right  04/18/2016  CLINICAL DATA:  Pain following fall EXAM: RIGHT FEMUR 1 VIEW COMPARISON:  Pelvis and right hip Apr 18, 2016 FINDINGS: Frontal view obtained. There is a subcapital femoral neck fracture on the right with varus angulation at the fracture site. No other fracture is evident. There is apparent lateral  patellar subluxation without frank apparent dislocation. There is soft tissue swelling in the knee region. Bones are osteoporotic. There is moderate osteoarthritic change in the right knee region. IMPRESSION: Subcapital femoral neck fracture on the right. Lateral patellar subluxation. No other fracture or frank dislocation. Soft tissue swelling in the knee region. Bones osteoporotic. Electronically Signed   By: Lowella Grip III M.D.   On: 04/18/2016 16:33   I have personally reviewed and evaluated these images and lab results as part of my medical decision-making.   EKG Interpretation None      MDM  Patient was seen and evaluated in stable condition.  Reported mechanical fall.  Patient neurovascularly intact.  Xray with right femoral neck fracture.  Patient given Dilaudid for pain control.  Discussed with Dr. Ninfa Linden from orthopedics who came bedside to see the patient.  Discussed with Dr. Olevia Bowens from [facility] who agreed with admission.  Patient admitted under the care of medicine  for further treatment and evaluation. Final diagnoses:  Closed right hip fracture, initial encounter ([place])    1. Right femoral neck fracture    Harvel Quale, MD 04/18/16 1758

## 2016-04-18 NOTE — Telephone Encounter (Signed)
rx called into pharmacy

## 2016-04-18 NOTE — H&P (Addendum)
History and Physical    Cassie Berg [id number] DOB: [birth year] DOA: 04/18/2016  Referring MD/NP/PA: Dr. Eulis Berg PCP: Cassie Maize, MD  Patient coming from: Home where she lives independently   Chief Complaint: Fall and right leg pain  HPI: Cassie Berg is a 82 y.o. female with medical history significant of  HTN and uterine cancer s/p hysterectomy; who presents after a fall. Patient had apparently been in her normal state of health able to live independently. Patient had been at home trying to get some ice cream out of the freezer, but the door was stuck and she had to pull the door harder than normal to open it. When she did this and door to her generator side as well as the freezer came open and patient fell backwards onto her bottom. Patient denies any loss of consciousness or trauma to her head with the fall. Patient was unable to get up after falling and complained of right leg pain. She has not eaten since this morning. Family arrived and had to assist her up, but she was unable to bear weight on her right leg and therefore had her transported to the emergency room.  Patient has never had a fracture before.  The last fall was sometime in 01/2016 for which family had to common assist her up when she slid off her bed. Patient's prescription currently not on any anticoagulants.  Only other history family provides is that the patient recently had a urinary tract infection for which she took ciprofloxacin and stopped course approximately 1-1/2 week ago. She notes that she normally has some urinary frequency for which she wears pads. Denies any dysuria, chest pain, shortness of breath, headache, fever, chills, changes in vision, or bleeding issues.  ED Course: Upon admission into the emergency department patient was evaluated and seen to be afebrile with all other vital signs within normal limits. Initial lab work was relatively unremarkable except for elevated BUN to creatinine ratio of 27:  1.19. Chest x-ray showed no acute abnormalities of concern. X-ray of the right hip showed a subcapital femoral neck fracture on the right with lateral patellar subluxation. Patient was evaluated by orthopedics Dr. Ninfa Linden in the ED and recommended surgery right hip hemiarthroplasty tonight if possible.  Review of Systems: As per HPI otherwise 10 point review of systems negative.   Past Medical History  Diagnosis Date  . Hypertension   . Cancer [facility])     age 13 - uterine cancer with hysterectomy and "cobalt"    Past Surgical History  Procedure Laterality Date  . Abdominal hysterectomy    . Appendectomy       reports that she has never smoked. She has quit using smokeless tobacco. Her smokeless tobacco use included Snuff. She reports that she does not drink alcohol or use illicit drugs.  Allergies  Allergen Reactions  . Cephalexin Hives    History reviewed. No pertinent family history.  Prior to Admission medications   Medication Sig Start Date End Date Taking? Authorizing Provider  aspirin 81 MG chewable tablet Chew 81 mg by mouth daily.   Yes Historical Provider, MD  bismuth subsalicylate (PEPTO BISMOL) 262 MG chewable tablet Chew 524 mg by mouth daily as needed for indigestion.   Yes Historical Provider, MD  lisinopril-hydrochlorothiazide (PRINZIDE,ZESTORETIC) 20-12.5 MG per tablet Take 1 tablet by mouth daily. 05/11/15  Yes Tiffany A Gann, PA-C  LORazepam (ATIVAN) 0.5 MG tablet TAKE 1 TABLET EVERY 6 HOURS AS NEEDED 04/18/16  Yes Mary-Margaret Hassell Done,  FNP  meclizine (ANTIVERT) 12.5 MG tablet TAKE 1 TABLET (12.5 MG TOTAL) BY MOUTH 3 (THREE) TIMES DAILY AS NEEDED. 05/11/15  Yes Tiffany A Gann, PA-C  Multiple Vitamins-Minerals (MULTIVITAMIN WITH MINERALS) tablet Take 1 tablet by mouth daily.   Yes Historical Provider, MD  ranitidine (ZANTAC) 150 MG tablet Take 150 mg by mouth daily as needed for heartburn.   Yes Historical Provider, MD    Physical Exam: Filed Vitals:   04/18/16  1546 04/18/16 1550 04/18/16 1830  BP:  145/54 132/49  Pulse:  68 78  Temp:  97.7 F (36.5 C)   TempSrc:  Oral   Resp:  16 16  SpO2: 98% 99% 98%      Constitutional: Elderly female in some mild discomfort, but alert and oriented 3 Filed Vitals:   04/18/16 1546 04/18/16 1550 04/18/16 1830  BP:  145/54 132/49  Pulse:  68 78  Temp:  97.7 F (36.5 C)   TempSrc:  Oral   Resp:  16 16  SpO2: 98% 99% 98%   Eyes: PERRL, lids and conjunctivae normal ENMT: Hard of hearing Mucous membranes are dry. Posterior pharynx clear of any exudate or lesions. Neck: normal, supple, no masses, no thyromegaly Respiratory: clear to auscultation bilaterally, no wheezing, no crackles. Normal respiratory effort. No accessory muscle use.  Cardiovascular: Regular rate and rhythm, no murmurs / rubs / gallops. Trace lower extremity edema bilaterally. 2+ pedal pulses. No carotid bruits.  Abdomen: no tenderness, no masses palpated. No hepatosplenomegaly. Bowel sounds positive.  Musculoskeletal: no clubbing / cyanosis. Right leg is externally rotated. Painful with any manipulation or movement Skin: Mild rash of the bilateral lower extremities suggestive of venous stasis. No visible open lesions or sores appreciated. Otherwise, skin is warm and dry. Skin lesion to the right lateral aspect of neck( Family notes that it's been present there for years). Neurologic: CN 2-12 grossly intact. Sensation intact, DTR normal. Patient able to move all 4 extremities.Marland Kitchen  Psychiatric: Normal judgment and insight. Alert and oriented x 3. Normal mood.     Labs on Admission: I have personally reviewed following labs and imaging studies  CBC:  Recent Labs Lab 04/18/16 1705  WBC 8.2  NEUTROABS 7.0  HGB 12.6  HCT 38.4  MCV 99.7  PLT 123XX123   Basic Metabolic Panel:  Recent Labs Lab 04/18/16 1705  NA 140  K 3.8  CL 103  CO2 27  GLUCOSE 113*  BUN 27*  CREATININE 1.19*  CALCIUM 9.7   GFR: CrCl cannot be calculated  (Unknown ideal weight.). Liver Function Tests: No results for input(s): AST, ALT, ALKPHOS, BILITOT, PROT, ALBUMIN in the last 168 hours. No results for input(s): LIPASE, AMYLASE in the last 168 hours. No results for input(s): AMMONIA in the last 168 hours. Coagulation Profile:  Recent Labs Lab 04/18/16 1705  INR 0.94   Cardiac Enzymes: No results for input(s): CKTOTAL, CKMB, CKMBINDEX, TROPONINI in the last 168 hours. BNP (last 3 results) No results for input(s): PROBNP in the last 8760 hours. HbA1C: No results for input(s): HGBA1C in the last 72 hours. CBG: No results for input(s): GLUCAP in the last 168 hours. Lipid Profile: No results for input(s): CHOL, HDL, LDLCALC, TRIG, CHOLHDL, LDLDIRECT in the last 72 hours. Thyroid Function Tests: No results for input(s): TSH, T4TOTAL, FREET4, T3FREE, THYROIDAB in the last 72 hours. Anemia Panel: No results for input(s): VITAMINB12, FOLATE, FERRITIN, TIBC, IRON, RETICCTPCT in the last 72 hours. Urine analysis:    Component Value Date/Time  BILIRUBINUR negative 09/12/2013 1642   PROTEINUR 4+ 09/12/2013 1642   UROBILINOGEN negative 09/12/2013 1642   NITRITE negative 09/12/2013 1642   LEUKOCYTESUR large (3+) 09/12/2013 1642   Sepsis Labs: No results found for this or any previous visit (from the past 240 hour(s)).   Radiological Exams on Admission: Dg Chest 1 View  04/18/2016  CLINICAL DATA:  Pain following fall EXAM: CHEST 1 VIEW COMPARISON:  None. FINDINGS: There is no edema or consolidation. Heart is upper normal in size with pulmonary vascularity within normal limits. There is a skin fold on the left but no apparent pneumothorax. No adenopathy. There is atherosclerotic calcification in the aorta. Postoperative changes noted in the right upper arm region. IMPRESSION: No edema or consolidation. Apparent skin fold on the left without apparent pneumothorax. Electronically Signed   By: Lowella Grip III M.D.   On: 04/18/2016 16:35     Dg Hip Unilat  With Pelvis 2-3 Views Right  04/18/2016  CLINICAL DATA:  Right hip pain after fall at home. Initial encounter. EXAM: DG HIP (WITH OR WITHOUT PELVIS) 2-3V RIGHT COMPARISON:  None. FINDINGS: Moderately displaced proximal right femoral neck fracture is noted. This appears to be closed and posttraumatic. No other fracture is noted. Vascular calcifications are noted. IMPRESSION: Moderately displaced proximal right femoral neck fracture. Electronically Signed   By: Marijo Conception, M.D.   On: 04/18/2016 16:32   Dg Femur 1v Right  04/18/2016  CLINICAL DATA:  Pain following fall EXAM: RIGHT FEMUR 1 VIEW COMPARISON:  Pelvis and right hip Apr 18, 2016 FINDINGS: Frontal view obtained. There is a subcapital femoral neck fracture on the right with varus angulation at the fracture site. No other fracture is evident. There is apparent lateral patellar subluxation without frank apparent dislocation. There is soft tissue swelling in the knee region. Bones are osteoporotic. There is moderate osteoarthritic change in the right knee region. IMPRESSION: Subcapital femoral neck fracture on the right. Lateral patellar subluxation. No other fracture or frank dislocation. Soft tissue swelling in the knee region. Bones osteoporotic. Electronically Signed   By: Lowella Grip III M.D.   On: 04/18/2016 16:33    EKG: Independently reviewed. Normal sinus rhythm  Assessment/Plan Closed right hip fracture: Acute. Patient reports falling backwards onto her right hip. X-ray of the right hip shows acute subcapital femoral neck fracture. Evaluated by orthopedics and recommended right hemiarthroplasty surgery tonight. Patient is in excellent health given age of 35  with risk factors only of hypertension and mild renal insufficiency. EKG shows normal sinus rhythm. Patient in sound mind going for a low risk procedure.  - Admit to a MedSurg bed - Patient currently going to OR. Will reassess following procedure for need of  any additional orders and/or changes. - Pain control: Hydrocodone(5/325 mg q 6hr prn moderate pain) /Dilaudid(0.25 - 0.5 mg q 2hr prn severe pain)  - Following procedure heart healthy diet  - Physical therapy to ambulate when given clearance by orthopedics - Consult social work and care management  - Follow-up with orthopedics in a.m.   Renal insufficiency:Question acute on chronic  Elevated BUN to creatinine ratio of 27:1.19 to suggest some signs of dehydration . Previous creatinine noted to be 1.07 back in 04/2015. - Gentle IV fluid hydration to be added following procedure and reassessment of fluid status - Follow-up BMP in a.m.   Essential hypertension: Blood pressure is stable at this time. - Hold lisinopril hydrochlorothiazide until able to reassess kidney function in a.m. following surgery  Recent urinary tract infection: Patient had reportedly taken ciprofloxacin for a week and half ago. Notes chronic history of urinary frequency. Suspect over active bladder. - Check urinalysis   Anxiety - Xanax prn   DVT prophylaxis:  Lovenox following procedure Code Status: Full Family Communication: Discussed with family at bedside  Disposition Plan: Undetermined discharge to inpatient rehabilitation versus home with home health Consults called: Dr. Rush Farmer Orthopedics Admission status: Inpatient MedSurg  Norval Morton MD [facility] Pager [phone]  If 7PM-7AM, please contact night-coverage [email or web address] Password [facility]  04/18/2016, 6:46 PM

## 2016-04-18 NOTE — Transfer of Care (Signed)
Immediate Anesthesia Transfer of Care Note  Patient: Cassie Berg  Procedure(s) Performed: Procedure(s): ANTERIOR APPROACH HEMI HIP ARTHROPLASTY (Right)  Patient Location: PACU  Anesthesia Type:Spinal  Level of Consciousness:  sedated, patient cooperative and responds to stimulation  Airway & Oxygen Therapy:Patient Spontanous Breathing and Patient connected to face mask oxgen  Post-op Assessment:  Report given to PACU RN and Post -op Vital signs reviewed and stable  Post vital signs:  Reviewed and stable, spinal T12  Last Vitals:  Filed Vitals:   04/18/16 1550 04/18/16 1830  BP: 145/54 132/49  Pulse: 68 78  Temp: 36.5 C   Resp: 16 16    Complications: No apparent anesthesia complications

## 2016-04-18 NOTE — Telephone Encounter (Signed)
Last filled 01/01/16, last seen 05/10/16, next appt 05/22/16. Call in at [facility]

## 2016-04-18 NOTE — Anesthesia Procedure Notes (Addendum)
Spinal Patient location during procedure: OR Start time: 04/18/2016 7:23 PM End time: 04/18/2016 7:32 PM Staffing Resident/CRNA: Darlys Gales R Performed by: resident/CRNA  Preanesthetic Checklist Completed: patient identified, site marked, surgical consent, pre-op evaluation, timeout performed, IV checked, risks and benefits discussed and monitors and equipment checked Spinal Block Patient position: right lateral decubitus Prep: ChloraPrep Patient monitoring: heart rate, continuous pulse ox, blood pressure and cardiac monitor Approach: midline Location: L3-4 Injection technique: single-shot Needle Needle type: Spinocan  Needle gauge: 22 G

## 2016-04-18 NOTE — ED Notes (Addendum)
Ortho at bedside.

## 2016-04-18 NOTE — Progress Notes (Signed)
Patient arrived on floor at 2308

## 2016-04-18 NOTE — ED Notes (Addendum)
Pt from home. Pt's refrigerator door was hard to open this afternoon. Pt jerked on door which suddenly opened and hit her on her R thigh. She then fell backwards onto her butt. Did not hit head. Pt then called family who assisted her into a chair prior to EMS arrival. Pt's R leg is shortened and rotated, but family told EMS this is normal for her. Pt reports pain only with movement. Able to move foot and strong pedal pulse. Also has skin tear on R wrist from 2 weeks ago.

## 2016-04-18 NOTE — ED Notes (Signed)
Bed: [id number] Expected date:  Expected time:  Means of arrival:  Comments: EMS- 82yo F, fall/R upper leg pain

## 2016-04-18 NOTE — Consult Note (Signed)
Reason for Consult:  Right hip fracture Referring Physician:   EDP  Cassie Berg is an 82 y.o. female.  HPI:   82 yo female who lives alone and had an accidental mechanical fall earlier today.  Was brought to the [facility] ED and found to have a right hip fracture.  She does complain of significant right hip pain.  At home, she does ambulate with a walker.  She only complains of right hip pain and denies other injuries.  She also denies any syncopal episode or CP/SOB.  Family is at the bedside.  Past Medical History  Diagnosis Date  . Hypertension   . Cancer [facility])     age 34 - uterine cancer with hysterectomy and "cobalt"    Past Surgical History  Procedure Laterality Date  . Abdominal hysterectomy    . Appendectomy      History reviewed. No pertinent family history.  Social History:  reports that she has never smoked. She has quit using smokeless tobacco. Her smokeless tobacco use included Snuff. She reports that she does not drink alcohol or use illicit drugs.  Allergies:  Allergies  Allergen Reactions  . Cephalexin Hives    Medications: I have reviewed the patient's current medications.  Results for orders placed or performed during the hospital encounter of 04/18/16 (from the past 48 hour(s))  CBC with Differential     Status: Abnormal   Collection Time: 04/18/16  5:05 PM  Result Value Ref Range   WBC 8.2 4.0 - 10.5 K/uL   RBC 3.85 (L) 3.87 - 5.11 MIL/uL   Hemoglobin 12.6 12.0 - 15.0 g/dL   HCT 38.4 36.0 - 46.0 %   MCV 99.7 78.0 - 100.0 fL   MCH 32.7 26.0 - 34.0 pg   MCHC 32.8 30.0 - 36.0 g/dL   RDW 12.9 11.5 - 15.5 %   Platelets 185 150 - 400 K/uL   Neutrophils Relative % 86 %   Neutro Abs 7.0 1.7 - 7.7 K/uL   Lymphocytes Relative 9 %   Lymphs Abs 0.7 0.7 - 4.0 K/uL   Monocytes Relative 5 %   Monocytes Absolute 0.4 0.1 - 1.0 K/uL   Eosinophils Relative 0 %   Eosinophils Absolute 0.0 0.0 - 0.7 K/uL   Basophils Relative 0 %   Basophils Absolute 0.0 0.0 - 0.1  K/uL  Basic metabolic panel     Status: Abnormal   Collection Time: 04/18/16  5:05 PM  Result Value Ref Range   Sodium 140 135 - 145 mmol/L   Potassium 3.8 3.5 - 5.1 mmol/L   Chloride 103 101 - 111 mmol/L   CO2 27 22 - 32 mmol/L   Glucose, Bld 113 (H) 65 - 99 mg/dL   BUN 27 (H) 6 - 20 mg/dL   Creatinine, Ser 1.19 (H) 0.44 - 1.00 mg/dL   Calcium 9.7 8.9 - 10.3 mg/dL   GFR calc non Af Amer 38 (L) >60 mL/min   GFR calc Af Amer 44 (L) >60 mL/min    Comment: (NOTE) The eGFR has been calculated using the CKD EPI equation. This calculation has not been validated in all clinical situations. eGFR's persistently <60 mL/min signify possible Chronic Kidney Disease.    Anion gap 10 5 - 15  Protime-INR     Status: None   Collection Time: 04/18/16  5:05 PM  Result Value Ref Range   Prothrombin Time 12.8 11.6 - 15.2 seconds   INR 0.94 0.00 - 1.49  Dg Chest 1 View  04/18/2016  CLINICAL DATA:  Pain following fall EXAM: CHEST 1 VIEW COMPARISON:  None. FINDINGS: There is no edema or consolidation. Heart is upper normal in size with pulmonary vascularity within normal limits. There is a skin fold on the left but no apparent pneumothorax. No adenopathy. There is atherosclerotic calcification in the aorta. Postoperative changes noted in the right upper arm region. IMPRESSION: No edema or consolidation. Apparent skin fold on the left without apparent pneumothorax. Electronically Signed   By: Lowella Grip III M.D.   On: 04/18/2016 16:35   Dg Hip Unilat  With Pelvis 2-3 Views Right  04/18/2016  CLINICAL DATA:  Right hip pain after fall at home. Initial encounter. EXAM: DG HIP (WITH OR WITHOUT PELVIS) 2-3V RIGHT COMPARISON:  None. FINDINGS: Moderately displaced proximal right femoral neck fracture is noted. This appears to be closed and posttraumatic. No other fracture is noted. Vascular calcifications are noted. IMPRESSION: Moderately displaced proximal right femoral neck fracture. Electronically Signed    By: Marijo Conception, M.D.   On: 04/18/2016 16:32   Dg Femur 1v Right  04/18/2016  CLINICAL DATA:  Pain following fall EXAM: RIGHT FEMUR 1 VIEW COMPARISON:  Pelvis and right hip Apr 18, 2016 FINDINGS: Frontal view obtained. There is a subcapital femoral neck fracture on the right with varus angulation at the fracture site. No other fracture is evident. There is apparent lateral patellar subluxation without frank apparent dislocation. There is soft tissue swelling in the knee region. Bones are osteoporotic. There is moderate osteoarthritic change in the right knee region. IMPRESSION: Subcapital femoral neck fracture on the right. Lateral patellar subluxation. No other fracture or frank dislocation. Soft tissue swelling in the knee region. Bones osteoporotic. Electronically Signed   By: Lowella Grip III M.D.   On: 04/18/2016 16:33    ROS Blood pressure 145/54, pulse 68, temperature 97.7 F (36.5 C), temperature source Oral, resp. rate 16, SpO2 99 %. Physical Exam  Assessment/Plan: Displaced right hip femoral neck fracture 1)  I have spoken to her and her family and would like to procedure with surgery this evening if medically clear. We had a discussion of the risks and benefits of surgery.  [facility] are seeing the patient as well.  Mcarthur Rossetti 04/18/2016, 6:16 PM

## 2016-04-18 NOTE — Anesthesia Preprocedure Evaluation (Addendum)
Anesthesia Evaluation  Patient identified by MRN, date of birth, ID band Patient awake    Reviewed: Allergy & Precautions, H&P , NPO status , Patient's Chart, lab work & pertinent test results  History of Anesthesia Complications Negative for: history of anesthetic complications  Airway Mallampati: II  TM Distance: >3 FB Neck ROM: full    Dental no notable dental hx.    Pulmonary neg pulmonary ROS,    Pulmonary exam normal breath sounds clear to auscultation       Cardiovascular hypertension, Pt. on medications Normal cardiovascular exam Rhythm:regular Rate:Normal     Neuro/Psych negative neurological ROS     GI/Hepatic negative GI ROS, Neg liver ROS,   Endo/Other  negative endocrine ROS  Renal/GU negative Renal ROS     Musculoskeletal   Abdominal   Peds  Hematology negative hematology ROS (+)   Anesthesia Other Findings Denies blood thinning meds or back surgery, no known heart lesions, she denies exertional dyspnea, syncope or chest pain of any sort in history. Denies lower extremity edema and until injury actually was very active with METS > 4. Her and her family would like spinal anesthesia option.  Reproductive/Obstetrics negative OB ROS                            Anesthesia Physical Anesthesia Plan  ASA: II  Anesthesia Plan: Spinal   Post-op Pain Management:    Induction: Intravenous  Airway Management Planned: Simple Face Mask  Additional Equipment:   Intra-op Plan:   Post-operative Plan:   Informed Consent: I have reviewed the patients History and Physical, chart, labs and discussed the procedure including the risks, benefits and alternatives for the proposed anesthesia with the patient or authorized representative who has indicated his/her understanding and acceptance.   Dental Advisory Given  Plan Discussed with: Anesthesiologist, CRNA and Surgeon  Anesthesia  Plan Comments:         Anesthesia Quick Evaluation

## 2016-04-18 NOTE — Telephone Encounter (Signed)
Please call in ativan with 1 refills 

## 2016-04-18 NOTE — ED Notes (Addendum)
Ortho and hospitalist spoke and decided to due surgery tonight. Called OR and they are ready whenever we can get her over. Will wipe down pt with CHG wipes as soon as hospitalist finishes examining the pt.

## 2016-04-18 NOTE — ED Notes (Signed)
Hospitalist at bedside 

## 2016-04-18 NOTE — Progress Notes (Signed)
CSW attempted to meet with pt at bedside. However, physician was present speaking with pt and family.  CSW will try to meet with pt again later.   Willette Brace [id number] ED CSW 04/18/2016 6:40 PM

## 2016-04-18 NOTE — Anesthesia Postprocedure Evaluation (Signed)
Anesthesia Post Note  Patient: Cassie Berg  Procedure(s) Performed: Procedure(s) (LRB): ANTERIOR APPROACH HEMI HIP ARTHROPLASTY (Right)  Patient location during evaluation: PACU Anesthesia Type: Spinal Level of consciousness: oriented and awake and alert Pain management: pain level controlled Vital Signs Assessment: post-procedure vital signs reviewed and stable Respiratory status: spontaneous breathing, respiratory function stable and patient connected to nasal cannula oxygen Cardiovascular status: blood pressure returned to baseline and stable Postop Assessment: no headache and no backache Anesthetic complications: no    Last Vitals:  Filed Vitals:   04/18/16 1550 04/18/16 1830  BP: 145/54 132/49  Pulse: 68 78  Temp: 36.5 C   Resp: 16 16    Last Pain: There were no vitals filed for this visit.               Zenaida Deed

## 2016-04-18 NOTE — ED Notes (Signed)
Nurse is in the room starting an IV

## 2016-04-18 NOTE — Brief Op Note (Signed)
04/18/2016  8:38 PM  PATIENT:  Cassie Berg  82 y.o. female  PRE-OPERATIVE DIAGNOSIS:  right hip fracture  POST-OPERATIVE DIAGNOSIS:  right hip fracture  PROCEDURE:  Procedure(s): ANTERIOR APPROACH HEMI HIP ARTHROPLASTY (Right)  SURGEON:  Surgeon(s) and Role:    * Mcarthur Rossetti, MD - Primary  PHYSICIAN ASSISTANT: Benita Stabile, PA-C  ANESTHESIA:   spinal  EBL:  Total I/O In: -  Out: 350 [Urine:250; Blood:100]  COUNTS:  YES  DICTATION: .Other Dictation: Dictation Number [id number]  PLAN OF CARE: Admit to inpatient   PATIENT DISPOSITION:  PACU - hemodynamically stable.   Delay start of Pharmacological VTE agent (>24hrs) due to surgical blood loss or risk of bleeding: no

## 2016-04-18 NOTE — ED Notes (Signed)
MD at bedside. 

## 2016-04-19 ENCOUNTER — Encounter (HOSPITAL_COMMUNITY): Payer: Self-pay | Admitting: Orthopaedic Surgery

## 2016-04-19 DIAGNOSIS — N179 Acute kidney failure, unspecified: Secondary | ICD-10-CM

## 2016-04-19 DIAGNOSIS — W19XXXS Unspecified fall, sequela: Secondary | ICD-10-CM

## 2016-04-19 LAB — BASIC METABOLIC PANEL
Anion gap: 10 (ref 5–15)
BUN: 25 mg/dL — AB (ref 6–20)
CHLORIDE: 105 mmol/L (ref 101–111)
CO2: 24 mmol/L (ref 22–32)
CREATININE: 1.12 mg/dL — AB (ref 0.44–1.00)
Calcium: 8.8 mg/dL — ABNORMAL LOW (ref 8.9–10.3)
GFR calc Af Amer: 47 mL/min — ABNORMAL LOW (ref >60)
GFR calc non Af Amer: 40 mL/min — ABNORMAL LOW (ref >60)
Glucose, Bld: 221 mg/dL — ABNORMAL HIGH (ref 65–99)
Potassium: 4.4 mmol/L (ref 3.5–5.1)
Sodium: 139 mmol/L (ref 135–145)

## 2016-04-19 LAB — URINE MICROSCOPIC-ADD ON

## 2016-04-19 LAB — CBC
HEMATOCRIT: 29.4 % — AB (ref 36.0–46.0)
HEMOGLOBIN: 9.8 g/dL — AB (ref 12.0–15.0)
MCH: 32.9 pg (ref 26.0–34.0)
MCHC: 33.3 g/dL (ref 30.0–36.0)
MCV: 98.7 fL (ref 78.0–100.0)
Platelets: 162 10*3/uL (ref 150–400)
RBC: 2.98 MIL/uL — ABNORMAL LOW (ref 3.87–5.11)
RDW: 12.8 % (ref 11.5–15.5)
WBC: 5.3 10*3/uL (ref 4.0–10.5)

## 2016-04-19 LAB — URINALYSIS, ROUTINE W REFLEX MICROSCOPIC
Bilirubin Urine: NEGATIVE
Glucose, UA: 100 mg/dL — AB
KETONES UR: NEGATIVE mg/dL
LEUKOCYTES UA: NEGATIVE
NITRITE: NEGATIVE
PH: 5 (ref 5.0–8.0)
Protein, ur: NEGATIVE mg/dL
SPECIFIC GRAVITY, URINE: 1.015 (ref 1.005–1.030)

## 2016-04-19 LAB — ABO/RH: ABO/RH(D): O POS

## 2016-04-19 MED ORDER — CHLORHEXIDINE GLUCONATE 0.12 % MT SOLN
15.0000 mL | Freq: Two times a day (BID) | OROMUCOSAL | Status: DC
  Administered 2016-04-19: 15 mL via OROMUCOSAL
  Filled 2016-04-19 (×3): qty 15

## 2016-04-19 MED ORDER — SODIUM CHLORIDE 0.9 % IV SOLN: Freq: Once | INTRAVENOUS | Status: DC

## 2016-04-19 MED ORDER — CETYLPYRIDINIUM CHLORIDE 0.05 % MT LIQD: 7.0000 mL | Freq: Two times a day (BID) | OROMUCOSAL | Status: DC

## 2016-04-19 NOTE — Progress Notes (Addendum)
Nutrition Brief Note  Patient identified on the Malnutrition Screening Tool (MST) Report  Wt Readings from Last 15 Encounters:  04/18/16 127 lb 3.3 oz (57.7 kg)  05/11/15 113 lb (51.256 kg)  07/01/14 119 lb 6.4 oz (54.159 kg)  09/12/13 118 lb (53.524 kg)  06/20/13 120 lb 6.4 oz (54.613 kg)    Body mass index is 24.05 kg/(m^2). Patient meets criteria for normal based on current BMI.   Current diet order is Heart Healthy, patient is consuming approximately 100% of meals at this time. Labs and medications reviewed.  She states her weight has been normal, PO intake has been good. Ate 100% of breakfast this morning. Pt lives on her own. States a normal weight of ~ 120# but is up some on admission due to edema from her hip fracture.   She does exhibit some mild muscle wasting and fat depletion throughout her body, but no more than is expected for a 82 yo woman.  No nutrition interventions warranted at this time. If nutrition issues arise, please consult RD.   Cassie Berg. Cassie Hegner, MS, RD LDN Inpatient Clinical Dietitian Pager [phone]

## 2016-04-19 NOTE — Progress Notes (Signed)
Occupational Therapy Evaluation Patient Details Name: Cassie Berg MRN: [id number] DOB: 12/15/1917 Today's Date: 04/19/2016    History of Present Illness 82yo F adm after fall at home with R femoral neck fracture. S/p ANTERIOR APPROACH HEMI HIP ARTHROPLASTY (Right) on 04/18/16.   Clinical Impression   Patient presents to OT with decreased ADL independence and safety due to the deficits listed below. She will benefit from skilled OT to maximize function and to facilitate a safe discharge. OT will follow.    Follow Up Recommendations  SNF    Equipment Recommendations  Other (comment) (tbd next venue)    Recommendations for Other Services       Precautions / Restrictions Precautions Precautions: Fall Restrictions Weight Bearing Restrictions: Yes RLE Weight Bearing: Weight bearing as tolerated      Mobility Bed Mobility Overal bed mobility: Needs Assistance;+ 2 for safety/equipment Bed Mobility: Supine to Sit     Supine to sit: Mod assist;+2 for safety/equipment;HOB elevated        Transfers Overall transfer level: Needs assistance Equipment used: Rolling walker (2 wheeled) Transfers: Sit to/from [facility] Sit to Stand: Min assist;+2 safety/equipment;From elevated surface Stand pivot transfers: Min assist;+2 safety/equipment            Balance                                            ADL Overall ADL's : Needs assistance/impaired Eating/Feeding: Set up;Sitting   Grooming: Wash/dry hands;Set up;Sitting   Upper Body Bathing: Moderate assistance;Bed level   Lower Body Bathing: Maximal assistance;Total assistance;Bed level   Upper Body Dressing : Moderate assistance;Bed level   Lower Body Dressing: Maximal assistance;Total assistance;Bed level   Toilet Transfer: Minimal assistance;+2 for safety/equipment;Stand-pivot;Cueing for safety;BSC;RW   Toileting- Clothing Manipulation and Hygiene: Total assistance;Sit to/from  stand       Functional mobility during ADLs: Moderate assistance;Minimal assistance;+2 for safety/equipment;Cueing for safety;Rolling walker       Vision     Perception     Praxis      Pertinent Vitals/Pain Pain Assessment: Faces Faces Pain Scale: Hurts a little bit Pain Location: R hip Pain Descriptors / Indicators: Sore Pain Intervention(s): Limited activity within patient's tolerance;Monitored during session;Repositioned     Hand Dominance Right   Extremity/Trunk Assessment Upper Extremity Assessment Upper Extremity Assessment: Overall WFL for tasks assessed   Lower Extremity Assessment Lower Extremity Assessment: Defer to PT evaluation   Cervical / Trunk Assessment Cervical / Trunk Assessment: Kyphotic   Communication Communication Communication: HOH   Cognition Arousal/Alertness: Awake/alert Behavior During Therapy: WFL for tasks assessed/performed Overall Cognitive Status: Within Functional Limits for tasks assessed                     General Comments       Exercises       Shoulder Instructions      Home Living Family/patient expects to be discharged to:: Unsure Living Arrangements: Alone                               Additional Comments: PTA, patient lived alone, granddaughter assisted with meals, cleaning, housework. Patient performed own sponge bathing, getting dressed, toileting. Has handicapped height toilet with grab bar. Sponge bathes only. Uses Rollator for mobility.      Prior Functioning/Environment Level  of Independence: Needs assistance  Gait / Transfers Assistance Needed: uses Rollator ADL's / Homemaking Assistance Needed: needs assistance with meals, cleaning, laundry, housework        OT Diagnosis: Acute pain   OT Problem List: Decreased strength;Decreased range of motion;Decreased activity tolerance;Impaired balance (sitting and/or standing);Decreased safety awareness;Decreased knowledge of use of DME or  AE;Decreased knowledge of precautions;Pain   OT Treatment/Interventions: Self-care/ADL training;DME and/or AE instruction;Therapeutic activities;Patient/family education    OT Goals(Current goals can be found in the care plan section) Acute Rehab OT Goals Patient Stated Goal: to be able to walk OT Goal Formulation: With patient Time For Goal Achievement: 05/03/16 Potential to Achieve Goals: Good ADL Goals Pt Will Perform Upper Body Bathing: with set-up;sitting Pt Will Perform Lower Body Bathing: with min assist;with adaptive equipment;sit to/from stand Pt Will Perform Upper Body Dressing: with set-up;sitting Pt Will Perform Lower Body Dressing: with min assist;with adaptive equipment;sit to/from stand Pt Will Transfer to Toilet: with supervision;bedside commode Pt Will Perform Toileting - Clothing Manipulation and hygiene: with supervision;sit to/from stand  OT Frequency: Min 2X/week   Barriers to D/C: Decreased caregiver support  lives alone       Co-evaluation PT/OT/SLP Co-Evaluation/Treatment: Yes Reason for Co-Treatment: For patient/therapist safety PT goals addressed during session: Mobility/safety with mobility OT goals addressed during session: ADL's and self-care      End of Session Equipment Utilized During Treatment: Gait belt;Rolling walker Nurse Communication: Mobility status  Activity Tolerance: Patient tolerated treatment well Patient left: in chair;with call bell/phone within reach;with chair alarm set   Time: [phone] OT Time Calculation (min): 21 min Charges:  OT General Charges $OT Visit: 1 Procedure OT Evaluation $OT Eval Moderate Complexity: 1 Procedure G-Codes:    Blakleigh Straw A 2016-04-29, 9:59 AM

## 2016-04-19 NOTE — Op Note (Signed)
NAMEPAGAN, LOVEALL NO.:  [id number]  MEDICAL RECORD NO.:  [id number]  LOCATION:  [place]                         FACILITY:  [facility]  PHYSICIAN:  Lind Guest. Ninfa Linden, M.D.DATE OF BIRTH:  July 31, 1918  DATE OF PROCEDURE:  04/18/2016 DATE OF DISCHARGE:                              OPERATIVE REPORT   PREOPERATIVE DIAGNOSIS:  Displaced right hip femoral neck fracture.  POSTOPERATIVE DIAGNOSIS:  Displaced right hip femoral neck fracture.  PROCEDURE:  Right hip hemiarthroplasty through direct anterior approach.  IMPLANTS:  DePuy Corail femoral component size 13 with standard offset, bipolar hemiarthroplasty head measuring 44-mm outer shell and a 28+ 1.5 inner hip ball.  SURGEON:  Lind Guest. Ninfa Linden, M.D.  ASSISTANT:  Erskine Emery, PA-C.  ANESTHESIA:  Spinal.  ANTIBIOTICS:  900 mg of IV clindamycin.  BLOOD LOSS:  100 mL.  COMPLICATIONS:  None.  INDICATIONS:  [name] is a 82 year old community ambulator, lives at home alone with family nearby.  She ambulates with a walker and was trying to get ice cream out of her fridge when she actually tripped and fell.  She had a non-syncopal fall and had the inability to ambulate with severe right hip pain afterwards.  She was brought to the [facility] Emergency Department and found to have a displaced right hip femoral neck fracture.  Internal Medicine and Orthopedics were consulted.  She was otherwise a healthy individual.  She was cleared for surgery.  I talked to the family in length about the need for a partial hip replacement given the nature of this fracture that I can accomplish through the direct anterior approach.  The risks and benefits of surgery were explained in full detail including the risk of acute blood loss anemia, nerve and vessel injury, fracture, infection, DVT and dislocation.  She understands she also has preoperative risks of issues relates to her age of being 82 years old.  Informed  consent was obtained.  PROCEDURE DESCRIPTION:  After informed consent was obtained, appropriate right hip was marked.  She was brought to the operating room, where spinal anesthesia was obtained while she was on her stretcher.  A Foley catheter was placed and then both feet had traction boots applied to them.  Next, she was placed supine on the Hana fracture table with the perineal post in place and both legs in inline skeletal traction devices, but no traction applied.  Her right operative hip was then prepped and draped with DuraPrep and sterile drapes.  A time-out was called, she was identified as correct patient, correct right hip.  We then made a standard anterior approach to the hip making our incision just inferior and posterior to the anterior superior iliac spine and carried this obliquely down the leg.  We dissected down the tensor fascia lata muscle.  The tensor fascia was then divided longitudinally, so we could proceed with a direct anterior approach to the hip.  We identified and cauterized the lateral femoral circumflex vessels and then identified the hip capsule.  I then opened up the hip capsule finding a large effusion and hematoma consistent with a hip fracture. We did find the femoral neck completely broken and displaced and so,  we made our femoral neck cut with an oscillating saw just proximal to the lesser trochanter, but distal to her fracture.  We removed remnants of the neck and then placed a corkscrew guide in the femoral head and removed the femoral head in its entirety and measured this to be a size 44 femoral head.  We trialed this and we were pleased with that fit.  We then turned our attention to the femur with the leg externally rotated to 120 degrees, extended and adducted.  We were able to release the lateral joint capsule and used a box cutting osteotome to enter the femoral canal and a rongeur to lateralize.  I placed a Mueller retractor medially and  a Hohmann retractor behind the greater trochanter.  I then began broaching from a size 8 broach using the Corail femoral broaching system up to a size 13.  With the 13 in place, we then trialed a standard neck and a 44/28 bipolar hip ball and construct and reduced this from the acetabulum.  We were pleased with leg length, offset and stability.  We then dislocated the hip and removed the trial components. We then placed the real size 13 Corail femoral component with standard offset and the real 44 - 28 +1.5 bipolar hip ball.  We reduced this back in the acetabulum and we were pleased with stability.  We then irrigated the soft tissue with normal saline solution using pulsatile lavage.  We closed the joint capsule with interrupted #1 Ethibond suture followed by running #1 Vicryl in the tensor fascia, 0 Vicryl in the deep tissue, 2-0 Vicryl in the subcutaneous tissue, interrupted staples on the skin. Xeroform and Aquacel dressing were applied.  She was taken off the Hana table, taken to the recovery room in stable condition.  All final counts were correct.  There were no complications noted.  Of note, Erskine Emery, PA-C assisted in the entire case, his assistance was crucial for facilitating all aspects of this case.     Lind Guest. Ninfa Linden, M.D.     CYB/MEDQ  D:  04/18/2016  T:  04/19/2016  Job:  [id number]

## 2016-04-19 NOTE — Progress Notes (Signed)
Subjective: 1 Day Post-Op Procedure(s) (LRB): ANTERIOR APPROACH HEMI HIP ARTHROPLASTY (Right) Patient reports pain as moderate.  Complaining only of soreness in right hip.  Objective: Vital signs in last 24 hours: Temp:  [97.7 F (36.5 C)-98.8 F (37.1 C)] 98.8 F (37.1 C) (05/31 0500) Pulse Rate:  [59-78] 68 (05/31 0500) Resp:  [11-17] 14 (05/31 0500) BP: (97-145)/(30-81) 110/45 mmHg (05/31 0510) SpO2:  [98 %-100 %] 100 % (05/31 0500) Weight:  [57.7 kg (127 lb 3.3 oz)] 57.7 kg (127 lb 3.3 oz) (05/30 2314)  Intake/Output from previous day: 05/30 0701 - 05/31 0700 In: 1970 [P.O.:120; I.V.:1850] Out: 825 [Urine:725; Blood:100] Intake/Output this shift:     Recent Labs  04/18/16 1705 04/19/16 0402  HGB 12.6 9.8*    Recent Labs  04/18/16 1705 04/19/16 0402  WBC 8.2 5.3  RBC 3.85* 2.98*  HCT 38.4 29.4*  PLT 185 162    Recent Labs  04/18/16 1705 04/19/16 0402  NA 140 139  K 3.8 4.4  CL 103 105  CO2 27 24  BUN 27* 25*  CREATININE 1.19* 1.12*  GLUCOSE 113* 221*  CALCIUM 9.7 8.8*    Recent Labs  04/18/16 1705  INR 0.94   Hight lower extremity: Intact pulses distally Dorsiflexion/Plantar flexion intact Incision: scant drainage Compartment soft  Assessment/Plan: 1 Day Post-Op Procedure(s) (LRB): ANTERIOR APPROACH HEMI HIP ARTHROPLASTY (Right) Up with therapy  Right leg weight bearing as tolerated  ABLA as expected due to surgery and fracture.   Jolly Bleicher 04/19/2016, 7:55 AM

## 2016-04-19 NOTE — Clinical Social Work Placement (Signed)
   CLINICAL SOCIAL WORK PLACEMENT  NOTE  Date:  04/19/2016  Patient Details  Name: Cassie Berg MRN: [id number] Date of Birth: 1918-03-27  Clinical Social Work is seeking post-discharge placement for this patient at the [place] level of care (*CSW will initial, date and re-position this form in  chart as items are completed):  Yes   Patient/family provided with [place] Work Department's list of facilities offering this level of care within the geographic area requested by the patient (or if unable, by the patient's family).  Yes   Patient/family informed of their freedom to choose among providers that offer the needed level of care, that participate in Medicare, Medicaid or managed care program needed by the patient, have an available bed and are willing to accept the patient.  Yes   Patient/family informed of [facility]'s ownership interest in [facility] and [facility], as well as of the fact that they are under no obligation to receive care at these facilities.  PASRR submitted to EDS on 04/19/16     PASRR number received on 04/19/16     Existing PASRR number confirmed on       FL2 transmitted to all facilities in geographic area requested by pt/family on 04/19/16     FL2 transmitted to all facilities within larger geographic area on       Patient informed that his/her managed care company has contracts with or will negotiate with certain facilities, including the following:        Yes   Patient/family informed of bed offers received.  Patient chooses bed at [facility]     Physician recommends and patient chooses bed at      Patient to be transferred to [facility] on  .  Patient to be transferred to facility by       Patient family notified on   of transfer.  Name of family member notified:        PHYSICIAN       Additional Comment:    _______________________________________________ Luretha Rued, [place] 04/19/2016, 11:38 AM

## 2016-04-19 NOTE — NC FL2 (Signed)
[place] MEDICAID FL2 LEVEL OF CARE SCREENING TOOL     IDENTIFICATION  Patient Name: Cassie Berg Birthdate: 05/11/1918 Sex: female Admission Date (Current Location): 04/18/2016  [facility] and [place] Number:  Engineer, manufacturing systems and Address:  [facility],  [place] [address], [place]      Provider Number: [id number]  Attending Physician Name and Address:  Robbie Lis, MD  Relative Name and Phone Number:       Current Level of Care: Hospital Recommended Level of Care: [place] Prior Approval Number:    Date Approved/Denied:   PASRR Number: [id number]  Discharge Plan: SNF    Current Diagnoses: Patient Active Problem List   Diagnosis Date Noted  . Closed right hip fracture ([place]) 04/18/2016  . Essential hypertension 04/18/2016  . Renal insufficiency 04/18/2016  . Anxiety state 04/18/2016    Orientation RESPIRATION BLADDER Height & Weight     Self, Situation, Place  Normal Continent Weight: 57.7 kg (127 lb 3.3 oz) Height:  5\' 1"  (154.9 cm)  BEHAVIORAL SYMPTOMS/MOOD NEUROLOGICAL BOWEL NUTRITION STATUS  Other (Comment) (No Behaviors)   Continent Diet  AMBULATORY STATUS COMMUNICATION OF NEEDS Skin   Extensive Assist Verbally Surgical wounds, Other (Comment) (Right arm wound due to fall.)                       Personal Care Assistance Level of Assistance  Bathing, Feeding, Dressing Bathing Assistance: Maximum assistance Feeding assistance: Independent Dressing Assistance: Maximum assistance     Functional Limitations Info  Sight, Hearing, Speech Sight Info: Adequate Hearing Info: Impaired Speech Info: Adequate    SPECIAL CARE FACTORS FREQUENCY  PT (By licensed PT), OT (By licensed OT)     PT Frequency: 5 x wk OT Frequency: 5 x wk            Contractures Contractures Info: Not present    Additional Factors Info  Code Status Code Status Info: FULL CODE             Current Medications (04/19/2016):   This is the current hospital active medication list Current Facility-Administered Medications  Medication Dose Route Frequency Provider Last Rate Last Dose  . 0.9 %  sodium chloride infusion   Intravenous Once Norval Morton, MD   75 mL/hr at 04/19/16 0447  . acetaminophen (TYLENOL) tablet 650 mg  650 mg Oral Q6H PRN Mcarthur Rossetti, MD       Or  . acetaminophen (TYLENOL) suppository 650 mg  650 mg Rectal Q6H PRN Mcarthur Rossetti, MD      . antiseptic oral rinse (CPC / CETYLPYRIDINIUM CHLORIDE 0.05%) solution 7 mL  7 mL Mouth Rinse q12n4p Charlynne Cousins, MD      . aspirin EC tablet 325 mg  325 mg Oral BID PC Mcarthur Rossetti, MD   325 mg at 04/19/16 0845  . bismuth subsalicylate (PEPTO BISMOL) chewable tablet 524 mg  524 mg Oral Daily PRN Norval Morton, MD      . chlorhexidine (HIBICLENS) 4 % liquid 4 application  60 mL Topical Once Mcarthur Rossetti, MD      . chlorhexidine (PERIDEX) 0.12 % solution 15 mL  15 mL Mouth Rinse BID Charlynne Cousins, MD   15 mL at 04/19/16 0053  . clindamycin (CLEOCIN) IVPB 600 mg  600 mg Intravenous Q6H Mcarthur Rossetti, MD   600 mg at 04/19/16 0846  . docusate sodium (COLACE) capsule 100  mg  100 mg Oral BID Mcarthur Rossetti, MD   100 mg at 04/18/16 2200  . famotidine (PEPCID) tablet 20 mg  20 mg Oral Daily Rondell A Tamala Julian, MD      . hydrochlorothiazide (MICROZIDE) capsule 12.5 mg  12.5 mg Oral Daily Charlynne Cousins, MD      . HYDROcodone-acetaminophen (NORCO/VICODIN) 5-325 MG per tablet 1-2 tablet  1-2 tablet Oral Q6H PRN Mcarthur Rossetti, MD      . HYDROmorphone (DILAUDID) injection 0.25-0.5 mg  0.25-0.5 mg Intravenous Q2H PRN Norval Morton, MD      . lisinopril (PRINIVIL,ZESTRIL) tablet 20 mg  20 mg Oral Daily Charlynne Cousins, MD      . LORazepam (ATIVAN) tablet 0.5 mg  0.5 mg Oral Q6H PRN Norval Morton, MD      . meclizine (ANTIVERT) tablet 12.5 mg  12.5 mg Oral TID PRN Norval Morton, MD      .  menthol-cetylpyridinium (CEPACOL) lozenge 3 mg  1 lozenge Oral PRN Mcarthur Rossetti, MD       Or  . phenol (CHLORASEPTIC) mouth spray 1 spray  1 spray Mouth/Throat PRN Mcarthur Rossetti, MD      . methocarbamol (ROBAXIN) tablet 500 mg  500 mg Oral Q6H PRN Mcarthur Rossetti, MD       Or  . methocarbamol (ROBAXIN) 500 mg in dextrose 5 % 50 mL IVPB  500 mg Intravenous Q6H PRN Mcarthur Rossetti, MD      . metoCLOPramide (REGLAN) tablet 5-10 mg  5-10 mg Oral Q8H PRN Mcarthur Rossetti, MD       Or  . metoCLOPramide (REGLAN) injection 5-10 mg  5-10 mg Intravenous Q8H PRN Mcarthur Rossetti, MD      . morphine 2 MG/ML injection 0.5 mg  0.5 mg Intravenous Q2H PRN Mcarthur Rossetti, MD      . multivitamin with minerals tablet 1 tablet  1 tablet Oral Daily Norval Morton, MD      . ondansetron (ZOFRAN) injection 4 mg  4 mg Intravenous Q6H PRN Harvel Quale, MD   4 mg at 04/18/16 1725  . ondansetron (ZOFRAN) tablet 4 mg  4 mg Oral Q6H PRN Mcarthur Rossetti, MD       Or  . ondansetron [facility]) injection 4 mg  4 mg Intravenous Q6H PRN Mcarthur Rossetti, MD         Discharge Medications: Please see discharge summary for a list of discharge medications.  Relevant Imaging Results:  Relevant Lab Results:   Additional Information SS # [id number]  Henretta Quist, Randall An, LCSW

## 2016-04-19 NOTE — Evaluation (Signed)
Physical Therapy Evaluation Patient Details Name: Cassie Berg MRN: [id number] DOB: Feb 01, 1918 Today's Date: 04/19/2016   History of Present Illness  82yo F adm after fall at home with R femoral neck fracture. S/p ANTERIOR APPROACH HEMI HIP ARTHROPLASTY (Right) on 04/18/16.  Clinical Impression  The patient is very pleasant. The patient was able to stand and pivot to [facility] then to recliner. The patient Pt admitted with above diagnosis. Pt currently with functional limitations due to the deficits listed below (see PT Problem List)  Pt will benefit from skilled PT to increase their independence and safety with mobility to allow discharge to the venue listed below.       Follow Up Recommendations SNF;Supervision/Assistance - 24 hour    Equipment Recommendations  None recommended by PT    Recommendations for Other Services       Precautions / Restrictions Precautions Precautions: Fall Restrictions Weight Bearing Restrictions: Yes RLE Weight Bearing: Weight bearing as tolerated      Mobility  Bed Mobility Overal bed mobility: Needs Assistance;+ 2 for safety/equipment Bed Mobility: Supine to Sit     Supine to sit: Mod assist;+2 for safety/equipment;HOB elevated     General bed mobility comments: assist with the leg to edge  Transfers Overall transfer level: Needs assistance Equipment used: Rolling walker (2 wheeled) Transfers: Sit to/from [facility] Sit to Stand: Min assist;+2 safety/equipment;From elevated surface Stand pivot transfers: Min assist;+2 safety/equipment       General transfer comment: cues for technique, use of RW to pivot from bed to [facility] then to Recliner with 180* turn, antalgic on right.  Ambulation/Gait                Stairs            Wheelchair Mobility    Modified Rankin (Stroke Patients Only)       Balance                                             Pertinent Vitals/Pain Pain Assessment:  Faces Faces Pain Scale: Hurts a little bit Pain Location: R thigh and hip Pain Descriptors / Indicators: Sore Pain Intervention(s): Limited activity within patient's tolerance;Monitored during session;Repositioned;Ice applied    Home Living Family/patient expects to be discharged to:: Skilled nursing facility Living Arrangements: Alone               Additional Comments: PTA, patient lived alone, granddaughter assisted with meals, cleaning, housework. Patient performed own sponge bathing, getting dressed, toileting. Has handicapped height toilet with grab bar. Sponge bathes only. Uses Rollator for mobility.    Prior Function Level of Independence: Needs assistance   Gait / Transfers Assistance Needed: uses Rollator  ADL's / Homemaking Assistance Needed: needs assistance with meals, cleaning, laundry, housework        Hand Dominance   Dominant Hand: Right    Extremity/Trunk Assessment   Upper Extremity Assessment: Defer to OT evaluation           Lower Extremity Assessment: RLE deficits/detail RLE Deficits / Details: able to bear weight on the Right  leg during transfer    Cervical / Trunk Assessment: Kyphotic  Communication   Communication: HOH  Cognition Arousal/Alertness: Awake/alert Behavior During Therapy: WFL for tasks assessed/performed Overall Cognitive Status: Within Functional Limits for tasks assessed  General Comments General comments (skin integrity, edema, etc.): pt reports she bruises easily    Exercises        Assessment/Plan    PT Assessment Patient needs continued PT services  PT Diagnosis Difficulty walking;Generalized weakness   PT Problem List Decreased strength;Decreased range of motion;Decreased activity tolerance;Decreased mobility;Decreased knowledge of use of DME;Decreased safety awareness;Decreased knowledge of precautions;Pain  PT Treatment Interventions DME instruction;Gait training;Functional  mobility training;Therapeutic activities;Therapeutic exercise;Patient/family education   PT Goals (Current goals can be found in the Care Plan section) Acute Rehab PT Goals Patient Stated Goal: to be able to walk PT Goal Formulation: With patient Time For Goal Achievement: 05/03/16 Potential to Achieve Goals: Good    Frequency Min 3X/week   Barriers to discharge Decreased caregiver support      Co-evaluation PT/OT/SLP Co-Evaluation/Treatment: Yes Reason for Co-Treatment: For patient/therapist safety PT goals addressed during session: Mobility/safety with mobility OT goals addressed during session: ADL's and self-care       End of Session Equipment Utilized During Treatment: Gait belt Activity Tolerance: Patient tolerated treatment well Patient left: in chair;with call bell/phone within reach;with chair alarm set Nurse Communication: Mobility status         Time: [id number] PT Time Calculation (min) (ACUTE ONLY): 21 min   Charges:   PT Evaluation $PT Eval Low Complexity: 1 Procedure     PT G CodesMarcelino Freestone PT [id number]  04/19/2016, 11:07 AM

## 2016-04-19 NOTE — Progress Notes (Addendum)
Patient ID: Cassie Berg, female   DOB: December 23, 1917, 82 y.o.   MRN: [id number]  PROGRESS NOTE    Cassie Berg  [id number] DOB: 01/12/1918 DOA: 04/18/2016  PCP: Eustaquio Maize, MD   Brief Narrative:   82 y.o. female with medical history significant of HTN and uterine cancer s/p hysterectomy; who presented to ED after a fall. She was found to have subcapital femoral neck fracture on the right with lateral patellar subluxation. Patient was evaluated by orthopedics Dr. Ninfa Linden in the ED and recommended surgery right hip hemiarthroplasty.   Assessment & Plan:   Principal Problem:   Closed right hip fracture ([place]) / Mechanical fall - Patient is status post mechanical fall - Patient sustained moderately displaced proximal right femoral neck fracture. - Patient is status post right hip hemiarthroplasty 04/18/2006 - Placement to skilled nursing facility per physical therapy recommendations  - Appreciate social work assisting with discharge planning  - Aspirin and SCDs for DVT prophylaxis  Active Problems:   Essential hypertension - Continue hydrochlorothiazide and lisinopril    Acute postoperative blood loss anemia - Hemoglobin down from 12.6 to 9.8 this morning. Check CBC tomorrow morning - Transfuse for hemoglobin less than 8    Acute kidney injury - Creatinine on this admission 1.19 and remained stable at 1.12 - Likely prerenal, dehydration    DVT prophylaxis: SCDs and aspirin Code Status: full code  Family Communication: Daughter at the bedside Disposition Plan: To skilled nursing facility Friday   Consultants:   Orthopedic surgery  Procedures:   ANTERIOR APPROACH HEMI HIP ARTHROPLASTY (Right) 04/18/2016  Antimicrobials:   Clindamycin preoperatively   Subjective: Pain controlled.   Objective: Filed Vitals:   04/19/16 0115 04/19/16 0215 04/19/16 0500 04/19/16 0510  BP: 124/53 113/44 105/30 110/45  Pulse: 74 62 68   Temp: 97.9 F (36.6 C) 98.4 F (36.9  C) 98.8 F (37.1 C)   TempSrc: Oral Oral Oral   Resp: 14 14 14    Height:      Weight:      SpO2: 100% 100% 100%     Intake/Output Summary (Last 24 hours) at 04/19/16 0748 Last data filed at 04/19/16 0600  Gross per 24 hour  Intake   1970 ml  Output    825 ml  Net   1145 ml   Filed Weights   04/18/16 2314  Weight: 57.7 kg (127 lb 3.3 oz)    Examination:  General exam: Appears calm and comfortable  Respiratory system: Clear to auscultation. Respiratory effort normal. Cardiovascular system: S1 & S2 heard, RRR. No JVD Gastrointestinal system: Abdomen is nondistended, soft and nontender. No organomegaly or masses felt. Normal bowel sounds heard. Central nervous system: Alert and oriented. No focal neurological deficits. Extremities: Symmetric 5 x 5 power. Skin: No rashes, lesions or ulcers Psychiatry: Judgement and insight appear normal. Mood & affect appropriate.   Data Reviewed: I have personally reviewed following labs and imaging studies  CBC:  Recent Labs Lab 04/18/16 1705 04/19/16 0402  WBC 8.2 5.3  NEUTROABS 7.0  --   HGB 12.6 9.8*  HCT 38.4 29.4*  MCV 99.7 98.7  PLT 185 0000000   Basic Metabolic Panel:  Recent Labs Lab 04/18/16 1705 04/19/16 0402  NA 140 139  K 3.8 4.4  CL 103 105  CO2 27 24  GLUCOSE 113* 221*  BUN 27* 25*  CREATININE 1.19* 1.12*  CALCIUM 9.7 8.8*   GFR: Estimated Creatinine Clearance: 24.6 mL/min (by C-G formula based on Cr of 1.12).  Liver Function Tests: No results for input(s): AST, ALT, ALKPHOS, BILITOT, PROT, ALBUMIN in the last 168 hours. No results for input(s): LIPASE, AMYLASE in the last 168 hours. No results for input(s): AMMONIA in the last 168 hours. Coagulation Profile:  Recent Labs Lab 04/18/16 1705  INR 0.94   Cardiac Enzymes: No results for input(s): CKTOTAL, CKMB, CKMBINDEX, TROPONINI in the last 168 hours. BNP (last 3 results) No results for input(s): PROBNP in the last 8760 hours. HbA1C: No results for  input(s): HGBA1C in the last 72 hours. CBG: No results for input(s): GLUCAP in the last 168 hours. Lipid Profile: No results for input(s): CHOL, HDL, LDLCALC, TRIG, CHOLHDL, LDLDIRECT in the last 72 hours. Thyroid Function Tests: No results for input(s): TSH, T4TOTAL, FREET4, T3FREE, THYROIDAB in the last 72 hours. Anemia Panel: No results for input(s): VITAMINB12, FOLATE, FERRITIN, TIBC, IRON, RETICCTPCT in the last 72 hours. Urine analysis:    Component Value Date/Time   BILIRUBINUR negative 09/12/2013 1642   PROTEINUR 4+ 09/12/2013 1642   UROBILINOGEN negative 09/12/2013 1642   NITRITE negative 09/12/2013 1642   LEUKOCYTESUR large (3+) 09/12/2013 1642   Sepsis Labs: @LABRCNTIP (procalcitonin:4,lacticidven:4)   Recent Results (from the past 240 hour(s))  Surgical pcr screen     Status: None   Collection Time: 04/18/16  7:12 PM  Result Value Ref Range Status   MRSA, PCR NEGATIVE NEGATIVE Final   Staphylococcus aureus NEGATIVE NEGATIVE Final      Radiology Studies: Dg Chest 1 View 04/18/2016  No edema or consolidation. Apparent skin fold on the left without apparent pneumothorax.   Pelvis Portable 04/18/2016   Right hip arthroplasty in satisfactory position on this frontal view. Mild cortical irregularity involving the right parasymphyseal region and pubic symphysis, age indeterminate.   Dg C-arm 61-120 Min 04/18/2016  Right hip arthroplasty in satisfactory position on this frontal view. Mild cortical irregularity involving the right parasymphyseal region and pubic symphysis, age indeterminate.   Dg Hip Operative Unilat With Pelvis Right 04/18/2016  Intraoperative fluoroscopic images during right hip arthroplasty.   Dg Hip Unilat  With Pelvis 2-3 Views Right 04/18/2016   Moderately displaced proximal right femoral neck fracture.   Dg Femur 1v Right 04/18/2016  Subcapital femoral neck fracture on the right. Lateral patellar subluxation. No other fracture or frank dislocation.  Soft tissue swelling in the knee region. Bones osteoporotic.    Scheduled Meds: . sodium chloride   Intravenous Once  . antiseptic oral rinse  7 mL Mouth Rinse q12n4p  . aspirin EC  325 mg Oral BID PC  . chlorhexidine  60 mL Topical Once  . chlorhexidine  15 mL Mouth Rinse BID  . clindamycin (CLEOCIN) IV  600 mg Intravenous Q6H  . docusate sodium  100 mg Oral BID  . famotidine  20 mg Oral Daily  . hydrochlorothiazide  12.5 mg Oral Daily  . lisinopril  20 mg Oral Daily  . multivitamin with minerals  1 tablet Oral Daily   Continuous Infusions:    LOS: 1 day    Time spent: 25 minutes  Greater than 50% of the time spent on counseling and coordinating the care.   Leisa Lenz, MD [facility] Pager [phone]  If 7PM-7AM, please contact night-coverage [email or web address] Password [facility] 04/19/2016, 7:48 AM

## 2016-04-19 NOTE — Clinical Social Work Note (Signed)
Clinical Social Work Assessment  Patient Details  Name: Cassie Berg MRN: [id number] Date of Birth: 1918-08-24  Date of referral:  04/19/16               Reason for consult:  Facility Placement, Discharge Planning                Permission sought to share information with:  Facility Art therapist granted to share information::  Yes, Verbal Permission Granted  Name::        Agency::     Relationship::     Contact Information:     Housing/Transportation Living arrangements for the past 2 months:  Single Family Home Source of Information:  Patient, Other (Comment Required) (Granddaughter) Patient Interpreter Needed:  None Criminal Activity/Legal Involvement Pertinent to Current Situation/Hospitalization:  No - Comment as needed Significant Relationships:   (Granddaughter) Lives with:  Other (Comment), Self Do you feel safe going back to the place where you live?  No (SNF is recommended.) Need for family participation in patient care:  Yes (Comment)  Care giving concerns:  Pt's care cannot be managed at home following hospital d/c.   Social Worker assessment / plan:  Pt hospitalized on 04/18/16 after falling while trying to open up her refrigerator door to get some ice cream. Pt sustained a right femoral neck fx which required surgery. CSW met with pt / family to assist with d/c planning.  Therapy has recommended [facility] at d/c. Pt's granddaughter, Cassie Berg [phone], supports this recommendation. Pt is reluctant but family reports that pt will accept [facility]. Pt / family has given CSW permission to initiate SNF search and has requested [facility]. SNF has been contacted and has made a bed offer. Family has been updated. CSW will continue to follow to assist with d/c planning needs.  Employment status:  Retired Forensic scientist:  Medicare PT Recommendations:  [place] / Referral to community resources:  [place]  Patient/Family's Response to care:  Family is in agreement with plan for [employer]. Pt is reluctant to go to SNF. Granddaughter reports : " I know my grandmother isn't happy about going to rehab but she will go. " Emotional support provided to pt / family.  Patient/Family's Understanding of and Emotional Response to Diagnosis, Current Treatment, and Prognosis:  Pt / family are aware of pt's medical status. Family reports that pt wants to return home at d/c but lives alone and doesn't have 24/7 assistance at home.  Emotional Assessment Appearance:  Appears stated age Attitude/Demeanor/Rapport:  Other (cooperative) Affect (typically observed):  Calm, Pleasant Orientation:  Oriented to Self, Oriented to Place, Oriented to Situation Alcohol / Substance use:  Not Applicable Psych involvement (Current and /or in the community):  No (Comment)  Discharge Needs  Concerns to be addressed:  Discharge Planning Concerns Readmission within the last 30 days:  No Current discharge risk:  None Barriers to Discharge:  No Barriers Identified   Luretha Rued, [place] 04/19/2016, 10:39 AM

## 2016-04-19 NOTE — Care Management Note (Signed)
Case Management Note  Patient Details  Name: Cassie Berg MRN: [id number] Date of Birth: 01/19/1918  Subjective/Objective:      S/p Right hip hemiarthroplasty through direct anterior approach              Action/Plan: Discharge planning per CSW  Expected Discharge Date:   (unknown)               Expected Discharge Plan:  Skilled Nursing Facility  In-House Referral:  Clinical Social Work  Discharge planning Services  CM Consult  Post Acute Care Choice:  NA Choice offered to:  NA  DME Arranged:  N/A DME Agency:  NA  HH Arranged:  NA HH Agency:  NA  Status of Service:  Completed, signed off  Medicare Important Message Given:    Date Medicare IM Given:    Medicare IM give by:    Date Additional Medicare IM Given:    Additional Medicare Important Message give by:     If discussed at [place] of Stay Meetings, dates discussed:    Additional Comments:  Guadalupe Maple, RN 04/19/2016, 10:04 AM [phone]

## 2016-04-20 DIAGNOSIS — D62 Acute posthemorrhagic anemia: Secondary | ICD-10-CM

## 2016-04-20 LAB — CBC
HEMATOCRIT: 24.1 % — AB (ref 36.0–46.0)
Hemoglobin: 8 g/dL — ABNORMAL LOW (ref 12.0–15.0)
MCH: 32.8 pg (ref 26.0–34.0)
MCHC: 33.2 g/dL (ref 30.0–36.0)
MCV: 98.8 fL (ref 78.0–100.0)
PLATELETS: 120 10*3/uL — AB (ref 150–400)
RBC: 2.44 MIL/uL — ABNORMAL LOW (ref 3.87–5.11)
RDW: 13.2 % (ref 11.5–15.5)
WBC: 5.8 10*3/uL (ref 4.0–10.5)

## 2016-04-20 LAB — BASIC METABOLIC PANEL
ANION GAP: 6 (ref 5–15)
BUN: 31 mg/dL — ABNORMAL HIGH (ref 6–20)
CALCIUM: 8.1 mg/dL — AB (ref 8.9–10.3)
CO2: 24 mmol/L (ref 22–32)
CREATININE: 1.21 mg/dL — AB (ref 0.44–1.00)
Chloride: 105 mmol/L (ref 101–111)
GFR, EST AFRICAN AMERICAN: 43 mL/min — AB (ref >60)
GFR, EST NON AFRICAN AMERICAN: 37 mL/min — AB (ref >60)
Glucose, Bld: 159 mg/dL — ABNORMAL HIGH (ref 65–99)
Potassium: 3.8 mmol/L (ref 3.5–5.1)
SODIUM: 135 mmol/L (ref 135–145)

## 2016-04-20 MED ORDER — ACETAMINOPHEN 325 MG PO TABS: 650.0000 mg | ORAL_TABLET | Freq: Four times a day (QID) | ORAL | Status: AC | PRN

## 2016-04-20 MED ORDER — ASPIRIN 325 MG PO TBEC: 325.0000 mg | DELAYED_RELEASE_TABLET | Freq: Every day | ORAL | Status: AC

## 2016-04-20 MED ORDER — ASPIRIN 325 MG PO TBEC: 325.0000 mg | DELAYED_RELEASE_TABLET | Freq: Every day | ORAL | Status: DC

## 2016-04-20 MED ORDER — DOCUSATE SODIUM 100 MG PO CAPS: 100.0000 mg | ORAL_CAPSULE | Freq: Two times a day (BID) | ORAL | Status: AC | PRN

## 2016-04-20 MED ORDER — ASPIRIN EC 325 MG PO TBEC
325.0000 mg | DELAYED_RELEASE_TABLET | Freq: Every day | ORAL | Status: DC
  Administered 2016-04-21: 325 mg via ORAL
  Filled 2016-04-20: qty 1

## 2016-04-20 MED ORDER — ASPIRIN 325 MG PO TBEC: 325.0000 mg | DELAYED_RELEASE_TABLET | Freq: Two times a day (BID) | ORAL | Status: DC

## 2016-04-20 MED ORDER — ENSURE ENLIVE PO LIQD
237.0000 mL | Freq: Three times a day (TID) | ORAL | Status: DC | PRN
  Administered 2016-04-20: 237 mL via ORAL

## 2016-04-20 MED ORDER — ENSURE ENLIVE PO LIQD: 237.0000 mL | Freq: Three times a day (TID) | ORAL | Status: AC | PRN

## 2016-04-20 NOTE — Progress Notes (Signed)
K.Schorr notified of abnormal lab results.

## 2016-04-20 NOTE — Discharge Summary (Signed)
Physician Discharge Summary  Cassie Berg [id number] DOB: 1918/06/02 DOA: 04/18/2016  PCP: Eustaquio Maize, MD  Admit date: 04/18/2016 Discharge date: 04/21/2016  Recommendations for Outpatient Follow-up:  1. Continue aspirin 325 mg daily instead of twice a day due to risk of bleeding 2. Hgb 8.0 on 6/1 and will be rechecked 6/2 prior to discharge   Discharge Diagnoses:  Principal Problem:   Closed right hip fracture [facility]) Active Problems:   Essential hypertension   Renal insufficiency   Anxiety state    Discharge Condition: stable   Diet recommendation: as tolerated   History of present illness:  82 y.o. female with medical history significant of HTN and uterine cancer s/p hysterectomy; who presented to ED after a fall. She was found to have subcapital femoral neck fracture on the right with lateral patellar subluxation. Patient was evaluated by orthopedics Dr. Ninfa Linden in the ED and recommended surgery right hip hemiarthroplasty.  Hospital Course:   Assessment & Plan:  Principal Problem:  Closed right hip fracture ([place]) / Mechanical fall - Patient is status post mechanical fall - Patient sustained moderately displaced proximal right femoral neck fracture. - Status post right hip hemiarthroplasty 04/18/2006 - Appreciate social work assisting with discharge planning  - Aspirin 325 mg daily for DVT prophylaxis   Active Problems:  Essential hypertension - Continue hydrochlorothiazide and lisinopril   Acute postoperative blood loss anemia - Hemoglobin down from 12.6 to 9.8 and 8 this mornign 6/1 - Will repeat CBC 6/2 prior to discharge    Acute kidney injury - Creatinine on this admission 1.19 and remained stable at 1.12 - Likely prerenal, dehydration    DVT prophylaxis: SCDs and aspirin Code Status: full code  Family Communication: Daughter at the bedside    Consultants:   Orthopedic surgery  Procedures:   ANTERIOR APPROACH HEMI HIP  ARTHROPLASTY (Right) 04/18/2016  Antimicrobials:   Clindamycin preoperatively   Signed:  Leisa Lenz, MD  [facility] 04/20/2016, 3:32 PM  Pager #: [phone]  Time spent in minutes: more than 30 minutes  Discharge Exam: Filed Vitals:   04/20/16 0451 04/20/16 1413  BP: 125/46 107/61  Pulse: 70 72  Temp: 98.8 F (37.1 C) 99.3 F (37.4 C)  Resp: 14 14   Filed Vitals:   04/19/16 1821 04/19/16 2053 04/20/16 0451 04/20/16 1413  BP: 93/72 117/47 125/46 107/61  Pulse: 67 77 70 72  Temp: 98.6 F (37 C) 99.5 F (37.5 C) 98.8 F (37.1 C) 99.3 F (37.4 C)  TempSrc: Oral Oral Oral Oral  Resp: 14 14 14 14   Height:      Weight:      SpO2: 100% 99% 100% 100%    General: Pt is alert, follows commands appropriately, not in acute distress Cardiovascular: Regular rate and rhythm, S1/S2 + Respiratory: Clear to auscultation bilaterally, no wheezing, no crackles, no rhonchi Abdominal: Soft, non tender, non distended, bowel sounds +, no guarding Extremities: no edema, pulses palpable bilaterally DP and PT Neuro: Grossly nonfocal  Discharge Instructions  Discharge Instructions    Call MD for:  difficulty breathing, headache or visual disturbances    Complete by:  As directed      Call MD for:  persistant dizziness or light-headedness    Complete by:  As directed      Call MD for:  persistant nausea and vomiting    Complete by:  As directed      Call MD for:  severe uncontrolled pain    Complete by:  As directed      Diet - low sodium heart healthy    Complete by:  As directed      Increase activity slowly    Complete by:  As directed             Medication List    STOP taking these medications        aspirin 81 MG chewable tablet  Replaced by:  aspirin 325 MG EC tablet     bismuth subsalicylate 99991111 MG chewable tablet  Commonly known as:  PEPTO BISMOL     LORazepam 0.5 MG tablet  Commonly known as:  ATIVAN      TAKE these medications         acetaminophen 325 MG tablet  Commonly known as:  TYLENOL  Take 2 tablets (650 mg total) by mouth every 6 (six) hours as needed for mild pain (or Fever >/= 101).     aspirin 325 MG EC tablet  Take 1 tablet (325 mg total) by mouth 2 (two) times daily after a meal.     docusate sodium 100 MG capsule  Commonly known as:  COLACE  Take 1 capsule (100 mg total) by mouth 2 (two) times daily as needed for mild constipation.     feeding supplement (ENSURE ENLIVE) Liqd  Take 237 mLs by mouth 3 (three) times daily with meals as needed (When patient is not eating food).     lisinopril-hydrochlorothiazide 20-12.5 MG tablet  Commonly known as:  PRINZIDE,ZESTORETIC  Take 1 tablet by mouth daily.     meclizine 12.5 MG tablet  Commonly known as:  ANTIVERT  TAKE 1 TABLET (12.5 MG TOTAL) BY MOUTH 3 (THREE) TIMES DAILY AS NEEDED.     multivitamin with minerals tablet  Take 1 tablet by mouth daily.     ranitidine 150 MG tablet  Commonly known as:  ZANTAC  Take 150 mg by mouth daily as needed for heartburn.           Follow-up Information    Follow up with [facility].   Specialty:  [place] information:   [address] [place] [phone]      Follow up with Eustaquio Maize, MD. Schedule an appointment as soon as possible for a visit in 2 weeks.   Specialty:  Pediatrics   Why:  Follow up appt after recent hospitalization   Contact information:   [place] [address] [phone]        The results of significant diagnostics from this hospitalization (including imaging, microbiology, ancillary and laboratory) are listed below for reference.    Significant Diagnostic Studies: Dg Chest 1 View  04/18/2016  CLINICAL DATA:  Pain following fall EXAM: CHEST 1 VIEW COMPARISON:  None. FINDINGS: There is no edema or consolidation. Heart is upper normal in size with pulmonary vascularity within normal limits.  There is a skin fold on the left but no apparent pneumothorax. No adenopathy. There is atherosclerotic calcification in the aorta. Postoperative changes noted in the right upper arm region. IMPRESSION: No edema or consolidation. Apparent skin fold on the left without apparent pneumothorax. Electronically Signed   By: Lowella Grip III M.D.   On: 04/18/2016 16:35   Pelvis Portable  04/18/2016  CLINICAL DATA:  Postop right hip arthroplasty EXAM: DG C-ARM 61-120 MIN; PORTABLE PELVIS 1-2 VIEWS COMPARISON:  Right hip radiographs dated 04/18/2016 at 1612 hours FINDINGS: Right hip  arthroplasty in satisfactory position on this frontal view. Overlying skin staples. Mild cortical irregularity involving the right parasymphyseal region and pubic symphysis, age indeterminate. Visualized bony pelvis is otherwise intact. Surgical clips overlying the bilateral pelvis. Vascular calcifications. Moderate degenerative changes of the lower lumbar spine. IMPRESSION: Right hip arthroplasty in satisfactory position on this frontal view. Mild cortical irregularity involving the right parasymphyseal region and pubic symphysis, age indeterminate. Electronically Signed   By: Julian Hy M.D.   On: 04/18/2016 21:41   Dg C-arm 61-120 Min  04/18/2016  CLINICAL DATA:  Postop right hip arthroplasty EXAM: DG C-ARM 61-120 MIN; PORTABLE PELVIS 1-2 VIEWS COMPARISON:  Right hip radiographs dated 04/18/2016 at 1612 hours FINDINGS: Right hip arthroplasty in satisfactory position on this frontal view. Overlying skin staples. Mild cortical irregularity involving the right parasymphyseal region and pubic symphysis, age indeterminate. Visualized bony pelvis is otherwise intact. Surgical clips overlying the bilateral pelvis. Vascular calcifications. Moderate degenerative changes of the lower lumbar spine. IMPRESSION: Right hip arthroplasty in satisfactory position on this frontal view. Mild cortical irregularity involving the right  parasymphyseal region and pubic symphysis, age indeterminate. Electronically Signed   By: Julian Hy M.D.   On: 04/18/2016 21:41   Dg Hip Operative Unilat With Pelvis Right  04/18/2016  CLINICAL DATA:  Right hip arthroplasty anterior approach EXAM: OPERATIVE RIGHT HIP (WITH PELVIS IF PERFORMED) 3 VIEWS TECHNIQUE: Fluoroscopic spot image(s) were submitted for interpretation post-operatively. FLUOROSCOPY TIME:  17 seconds COMPARISON:  Right hip radiographs dated 04/18/2016 FINDINGS: Intraoperative fluoroscopic images during right hip arthroplasty demonstrate a right hip prosthesis in satisfactory position. IMPRESSION: Intraoperative fluoroscopic images during right hip arthroplasty. Electronically Signed   By: Julian Hy M.D.   On: 04/18/2016 21:34   Dg Hip Unilat  With Pelvis 2-3 Views Right  04/18/2016  CLINICAL DATA:  Right hip pain after fall at home. Initial encounter. EXAM: DG HIP (WITH OR WITHOUT PELVIS) 2-3V RIGHT COMPARISON:  None. FINDINGS: Moderately displaced proximal right femoral neck fracture is noted. This appears to be closed and posttraumatic. No other fracture is noted. Vascular calcifications are noted. IMPRESSION: Moderately displaced proximal right femoral neck fracture. Electronically Signed   By: Marijo Conception, M.D.   On: 04/18/2016 16:32   Dg Femur 1v Right  04/18/2016  CLINICAL DATA:  Pain following fall EXAM: RIGHT FEMUR 1 VIEW COMPARISON:  Pelvis and right hip Apr 18, 2016 FINDINGS: Frontal view obtained. There is a subcapital femoral neck fracture on the right with varus angulation at the fracture site. No other fracture is evident. There is apparent lateral patellar subluxation without frank apparent dislocation. There is soft tissue swelling in the knee region. Bones are osteoporotic. There is moderate osteoarthritic change in the right knee region. IMPRESSION: Subcapital femoral neck fracture on the right. Lateral patellar subluxation. No other fracture or frank  dislocation. Soft tissue swelling in the knee region. Bones osteoporotic. Electronically Signed   By: Lowella Grip III M.D.   On: 04/18/2016 16:33    Microbiology: Recent Results (from the past 240 hour(s))  Surgical pcr screen     Status: None   Collection Time: 04/18/16  7:12 PM  Result Value Ref Range Status   MRSA, PCR NEGATIVE NEGATIVE Final   Staphylococcus aureus NEGATIVE NEGATIVE Final    Comment:        The Xpert SA Assay (FDA approved for NASAL specimens in patients over 48 years of age), is one component of a comprehensive surveillance program.  Test performance has  been validated by [facility] for patients greater than or equal to 49 year old. It is not intended to diagnose infection nor to guide or monitor treatment.      Labs: Basic Metabolic Panel:  Recent Labs Lab 04/18/16 1705 04/19/16 0402 04/20/16 0409  NA 140 139 135  K 3.8 4.4 3.8  CL 103 105 105  CO2 27 24 24   GLUCOSE 113* 221* 159*  BUN 27* 25* 31*  CREATININE 1.19* 1.12* 1.21*  CALCIUM 9.7 8.8* 8.1*   Liver Function Tests: No results for input(s): AST, ALT, ALKPHOS, BILITOT, PROT, ALBUMIN in the last 168 hours. No results for input(s): LIPASE, AMYLASE in the last 168 hours. No results for input(s): AMMONIA in the last 168 hours. CBC:  Recent Labs Lab 04/18/16 1705 04/19/16 0402 04/20/16 0409  WBC 8.2 5.3 5.8  NEUTROABS 7.0  --   --   HGB 12.6 9.8* 8.0*  HCT 38.4 29.4* 24.1*  MCV 99.7 98.7 98.8  PLT 185 162 120*   Cardiac Enzymes: No results for input(s): CKTOTAL, CKMB, CKMBINDEX, TROPONINI in the last 168 hours. BNP: BNP (last 3 results) No results for input(s): BNP in the last 8760 hours.  ProBNP (last 3 results) No results for input(s): PROBNP in the last 8760 hours.  CBG: No results for input(s): GLUCAP in the last 168 hours.

## 2016-04-20 NOTE — Progress Notes (Addendum)
CSW assisting with d/c planning needs. [facility] is able to accept pt for [facility] Rehab on Friday if pt is stable for d/c. PN reviewed. Pt has not yet been up ambulating with PT.   Werner Lean LCSW [phone]

## 2016-04-20 NOTE — Progress Notes (Signed)
Subjective: 2 Days Post-Op Procedure(s) (LRB): ANTERIOR APPROACH HEMI HIP ARTHROPLASTY (Right) Patient reports pain as moderate.  Working with therapy.  Family at the bedside.  Going to skilled nursing tomorrow.  Objective: Vital signs in last 24 hours: Temp:  [98.6 F (37 C)-99.5 F (37.5 C)] 99.3 F (37.4 C) (06/01 1413) Pulse Rate:  [67-77] 72 (06/01 1413) Resp:  [14] 14 (06/01 1413) BP: (93-125)/(46-72) 107/61 mmHg (06/01 1413) SpO2:  [99 %-100 %] 100 % (06/01 1413)  Intake/Output from previous day: 05/31 0701 - 06/01 0700 In: 0  Out: 1050 [Urine:1050] Intake/Output this shift: Total I/O In: 720 [P.O.:720] Out: 150 [Urine:150]   Recent Labs  04/18/16 1705 04/19/16 0402 04/20/16 0409  HGB 12.6 9.8* 8.0*    Recent Labs  04/19/16 0402 04/20/16 0409  WBC 5.3 5.8  RBC 2.98* 2.44*  HCT 29.4* 24.1*  PLT 162 120*    Recent Labs  04/19/16 0402 04/20/16 0409  NA 139 135  K 4.4 3.8  CL 105 105  CO2 24 24  BUN 25* 31*  CREATININE 1.12* 1.21*  GLUCOSE 221* 159*  CALCIUM 8.8* 8.1*    Recent Labs  04/18/16 1705  INR 0.94    Intact pulses distally Dorsiflexion/Plantar flexion intact Incision: scant drainage  Assessment/Plan: 2 Days Post-Op Procedure(s) (LRB): ANTERIOR APPROACH HEMI HIP ARTHROPLASTY (Right) Up with therapy Plan for discharge tomorrow Discharge to SNF  [facility] 04/20/2016, 5:58 PM

## 2016-04-20 NOTE — Discharge Instructions (Addendum)
Hip Fracture A hip fracture is a fracture of the upper part of your thigh bone (femur).  CAUSES A hip fracture is caused by a direct blow to the side of your hip. This is usually the result of a fall but can occur in other circumstances, such as an automobile accident. RISK FACTORS There is an increased risk of hip fractures in people with:  An unsteady walking pattern (gait) and those with conditions that contribute to poor balance, such as Parkinson's disease or dementia.  Osteopenia and osteoporosis.  Cancer that spreads to the leg bones.  Certain metabolic diseases. SYMPTOMS  Symptoms of hip fracture include:  Pain over the injured hip.  Inability to put weight on the leg in which the fracture occurred (although, some patients are able to walk after a hip fracture).  Toes and foot of the affected leg point outward when you lie down. DIAGNOSIS A physical exam can determine if a hip fracture is likely to have occurred. X-ray exams are needed to confirm the fracture and to look for other injuries. The X-ray exam can help to determine the type of hip fracture. Rarely, the fracture is not visible on an X-ray image and a CT scan or MRI will have to be done. TREATMENT  The treatment for a fracture is usually surgery. This means using a screw, nail, or rod to hold the bones in place.  HOME CARE INSTRUCTIONS Take all medicines as directed by your health care provider. SEEK MEDICAL CARE IF: Pain continues, even after taking pain medicine. MAKE SURE YOU:  Understand these instructions.   Will watch your condition.  Will get help right away if you are not doing well or get worse.   This information is not intended to replace advice given to you by your health care provider. Make sure you discuss any questions you have with your health care provider.   Document Released: 11/06/2005 Document Revised: 11/11/2013 Document Reviewed: 06/18/2013 Elsevier Interactive Patient Education [employer]   Can leave her right hip dressing on and in place until her Orthopedic outpatient follow-up Can get her dressing wet in the shower. Full weight bearing as tolerated right hip; no hip precautions.

## 2016-04-21 DIAGNOSIS — S72001S Fracture of unspecified part of neck of right femur, sequela: Secondary | ICD-10-CM

## 2016-04-21 DIAGNOSIS — I10 Essential (primary) hypertension: Secondary | ICD-10-CM

## 2016-04-21 LAB — BASIC METABOLIC PANEL
Anion gap: 7 (ref 5–15)
BUN: 35 mg/dL — AB (ref 6–20)
CO2: 27 mmol/L (ref 22–32)
CREATININE: 1.27 mg/dL — AB (ref 0.44–1.00)
Calcium: 8.2 mg/dL — ABNORMAL LOW (ref 8.9–10.3)
Chloride: 105 mmol/L (ref 101–111)
GFR calc Af Amer: 40 mL/min — ABNORMAL LOW (ref >60)
GFR, EST NON AFRICAN AMERICAN: 35 mL/min — AB (ref >60)
Glucose, Bld: 112 mg/dL — ABNORMAL HIGH (ref 65–99)
POTASSIUM: 4.1 mmol/L (ref 3.5–5.1)
SODIUM: 139 mmol/L (ref 135–145)

## 2016-04-21 LAB — CBC
HCT: 24.1 % — ABNORMAL LOW (ref 36.0–46.0)
Hemoglobin: 7.9 g/dL — ABNORMAL LOW (ref 12.0–15.0)
MCH: 32.6 pg (ref 26.0–34.0)
MCHC: 32.8 g/dL (ref 30.0–36.0)
MCV: 99.6 fL (ref 78.0–100.0)
PLATELETS: 133 10*3/uL — AB (ref 150–400)
RBC: 2.42 MIL/uL — AB (ref 3.87–5.11)
RDW: 13.5 % (ref 11.5–15.5)
WBC: 5.9 10*3/uL (ref 4.0–10.5)

## 2016-04-21 LAB — PREPARE RBC (CROSSMATCH)

## 2016-04-21 MED ORDER — SODIUM CHLORIDE 0.9 % IV SOLN
Freq: Once | INTRAVENOUS | Status: AC
  Administered 2016-04-21: 10:00:00 via INTRAVENOUS

## 2016-04-21 NOTE — Care Management Important Message (Signed)
Important Message  Patient Details  Name: Cassie Berg MRN: [id number] Date of Birth: [birth year]   Medicare Important Message Given:  Yes    Camillo Flaming 04/21/2016, 10:53 AMImportant Message  Patient Details  Name: Cassie Berg MRN: [id number] Date of Birth: 1917/12/26   Medicare Important Message Given:  Yes    Camillo Flaming 04/21/2016, 10:52 AMImportant Message  Patient Details  Name: Cassie Berg MRN: [id number] Date of Birth: 01/14/1918   Medicare Important Message Given:  Yes    Camillo Flaming 04/21/2016, 10:52 AM

## 2016-04-21 NOTE — Progress Notes (Signed)
Physical Therapy Treatment Patient Details Name: Cassie Berg MRN: [id number] DOB: 28-Mar-1918 Today's Date: 23-Apr-2016    History of Present Illness 82yo F adm after fall at home with R femoral neck fracture. S/p ANTERIOR APPROACH HEMI HIP ARTHROPLASTY (Right) on 04/18/16.    PT Comments    Pt progressing very well, will benefit from  SNF post acute  Follow Up Recommendations  SNF;Supervision/Assistance - 24 hour     Equipment Recommendations  None recommended by PT    Recommendations for Other Services       Precautions / Restrictions Precautions Precautions: Fall Restrictions Weight Bearing Restrictions: No RLE Weight Bearing: Weight bearing as tolerated    Mobility  Bed Mobility               General bed mobility comments: pt in chair on PT arrival  Transfers Overall transfer level: Needs assistance Equipment used: Rolling walker (2 wheeled) Transfers: Sit to/from Stand Sit to Stand: Min guard         General transfer comment: cues for safe transition,to scoot to edge and get feet on floor; pt requires incr time, min/guard only for safety  Ambulation/Gait Ambulation/Gait assistance: Min guard;Min assist Ambulation Distance (Feet): 40 Feet Assistive device: Rolling walker (2 wheeled) Gait Pattern/deviations: Step-to pattern;Trunk flexed     General Gait Details: multi-modal cues for posture, step length, RW position   Stairs            Wheelchair Mobility    Modified Rankin (Stroke Patients Only)       Balance           Standing balance support: No upper extremity supported Standing balance-Leahy Scale: Fair                      Cognition Arousal/Alertness: Awake/alert Behavior During Therapy: WFL for tasks assessed/performed Overall Cognitive Status: Within Functional Limits for tasks assessed                      Exercises      General Comments        Pertinent Vitals/Pain Pain Assessment:  Faces Faces Pain Scale: Hurts a little bit Pain Location: R hip Pain Descriptors / Indicators: Aching;Sore Pain Intervention(s): Limited activity within patient's tolerance;Monitored during session    Home Living                      Prior Function            PT Goals (current goals can now be found in the care plan section) Acute Rehab PT Goals Patient Stated Goal: to be able to walk PT Goal Formulation: With patient Time For Goal Achievement: 05/03/16 Potential to Achieve Goals: Good Progress towards PT goals: Progressing toward goals    Frequency  Min 3X/week    PT Plan Current plan remains appropriate    Co-evaluation             End of Session Equipment Utilized During Treatment: Gait belt Activity Tolerance: Patient tolerated treatment well Patient left: in chair;with call bell/phone within reach;with chair alarm set     Time: 1037-1050 PT Time Calculation (min) (ACUTE ONLY): 13 min  Charges:  $Gait Training: 8-22 mins                    G Codes:      Tullio Chausse 04-23-2016, 11:09 AM

## 2016-04-21 NOTE — Progress Notes (Addendum)
This is an addendum to DC summary by Dr.Devine from 6/1 Pt remains stable without any complaints, however due to post op blood loss anemia will transfuse 1 unit of Blood prior to DC to Rehab today for Hb of 7.9. Also D/w POA Granddaughter regarding Code status, she confirms DNR  Domenic Polite, MD

## 2016-04-21 NOTE — Progress Notes (Signed)
Called and gave report to Joseph at [facility]. Answered all questions and concerns. Will be transported by PTR. Will continue to monitor until transfer to SNF.

## 2016-04-21 NOTE — Clinical Social Work Placement (Signed)
   CLINICAL SOCIAL WORK PLACEMENT  NOTE  Date:  04/21/2016  Patient Details  Name: Cassie Berg MRN: [id number] Date of Birth: Jan 01, 1918  Clinical Social Work is seeking post-discharge placement for this patient at the [place] level of care (*CSW will initial, date and re-position this form in  chart as items are completed):  Yes   Patient/family provided with [place] Work Department's list of facilities offering this level of care within the geographic area requested by the patient (or if unable, by the patient's family).  Yes   Patient/family informed of their freedom to choose among providers that offer the needed level of care, that participate in Medicare, Medicaid or managed care program needed by the patient, have an available bed and are willing to accept the patient.  Yes   Patient/family informed of 's ownership interest in [facility] and [facility], as well as of the fact that they are under no obligation to receive care at these facilities.  PASRR submitted to EDS on 04/19/16     PASRR number received on 04/19/16     Existing PASRR number confirmed on       FL2 transmitted to all facilities in geographic area requested by pt/family on 04/19/16     FL2 transmitted to all facilities within larger geographic area on       Patient informed that his/her managed care company has contracts with or will negotiate with certain facilities, including the following:        Yes   Patient/family informed of bed offers received.  Patient chooses bed at [facility]     Physician recommends and patient chooses bed at      Patient to be transferred to [facility] on 04/21/16.  Patient to be transferred to facility by [facility]     Patient family notified on 04/21/16 of transfer.  Name of family member notified:  GRANDDAUGHTER     PHYSICIAN       Additional Comment: Pt / granddaughter are in agreement with d/c to  [facility] today. [facility] transport required. Pt / family are aware out of pocket costs may be associated with [facility] transport. D/C Summary sent to SNF for review. No scripts required. # for report provided to nsg.   _______________________________________________ Luretha Rued, [place]  [phone] 04/21/2016, 12:58 PM

## 2016-04-24 LAB — TYPE AND SCREEN
ABO/RH(D): O POS
Antibody Screen: NEGATIVE
Unit division: 0

## 2016-05-04 ENCOUNTER — Ambulatory Visit: Payer: Medicare Other | Admitting: Family

## 2016-05-05 ENCOUNTER — Ambulatory Visit: Payer: Medicare Other | Admitting: Family

## 2016-05-22 ENCOUNTER — Ambulatory Visit: Payer: Medicare Other | Admitting: Nurse Practitioner

## 2017-07-31 IMAGING — DX DG PORTABLE PELVIS
2 series · 2 of 2 positions shown · non-contrast
Comparison: Right hip radiographs dated 04/18/2016 at 7272 hours

CLINICAL DATA: Postop right hip arthroplasty

EXAM:
DG C-ARM 61-120 MIN; PORTABLE PELVIS 1-2 VIEWS

[pelvis ap]
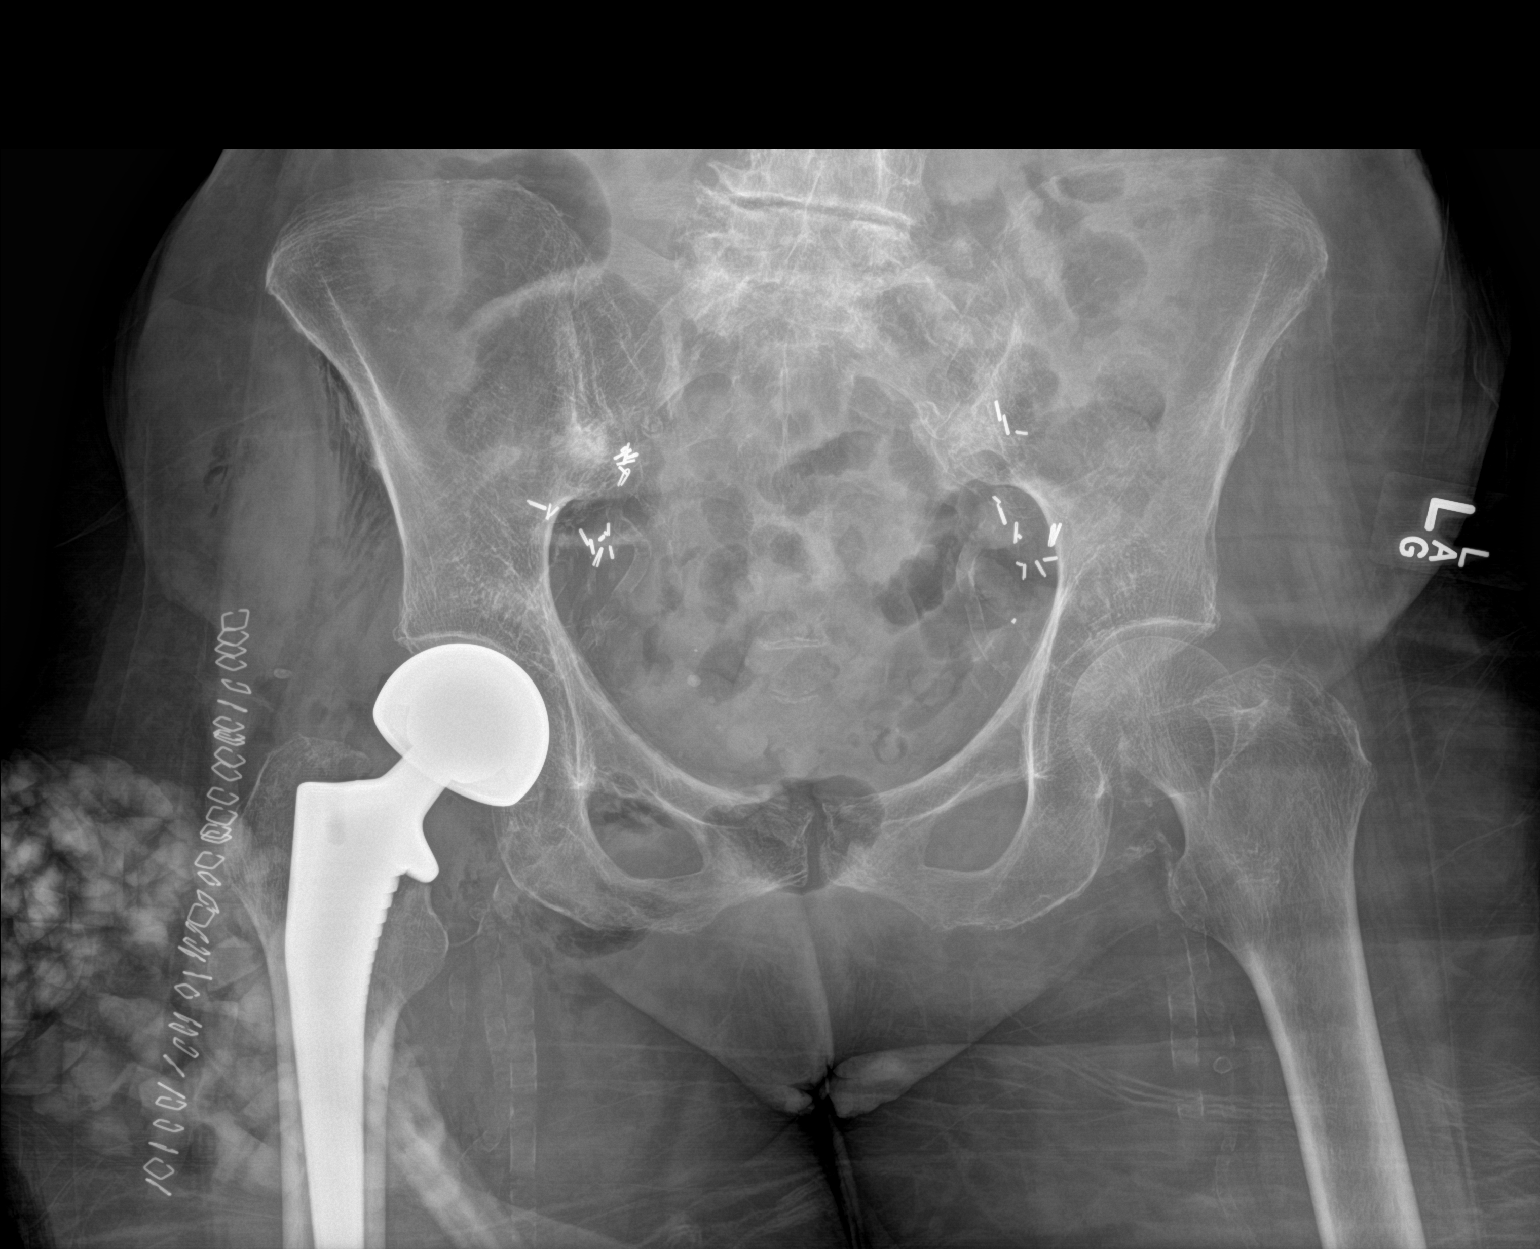

[pelvis lat]
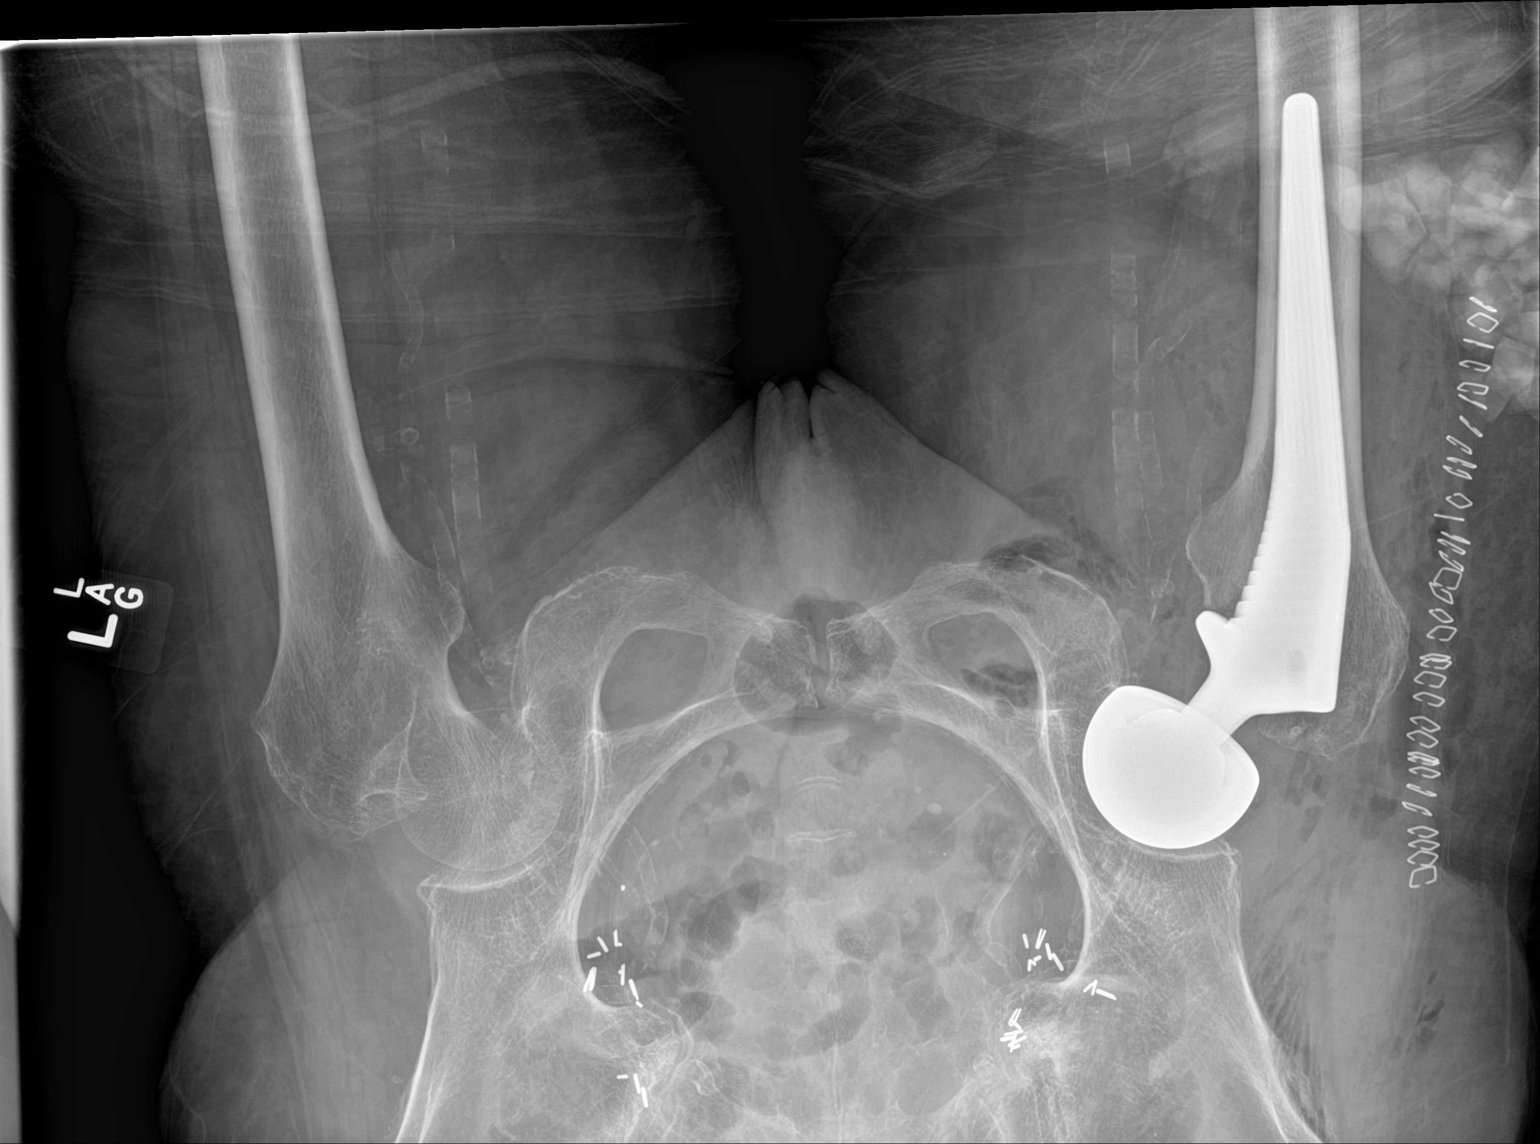

[2 of 2 positions shown; findings below may reference images not displayed]

FINDINGS: Right hip arthroplasty in satisfactory position on this frontal
view. Overlying skin staples.

Mild cortical irregularity involving the right parasymphyseal region
and pubic symphysis, age indeterminate. Visualized bony pelvis is
otherwise intact.

Surgical clips overlying the bilateral pelvis. Vascular
calcifications.

Moderate degenerative changes of the lower lumbar spine.
IMPRESSION: Right hip arthroplasty in satisfactory position on this frontal
view.

Mild cortical irregularity involving the right parasymphyseal region
and pubic symphysis, age indeterminate.

## 2017-07-31 IMAGING — CR DG HIP (WITH OR WITHOUT PELVIS) 2-3V*R*
3 series · 3 of 3 positions shown · non-contrast
Comparison: None.

CLINICAL DATA: Right hip pain after fall at home. Initial
encounter.

EXAM:
DG HIP (WITH OR WITHOUT PELVIS) 2-3V RIGHT

[x pelvis]
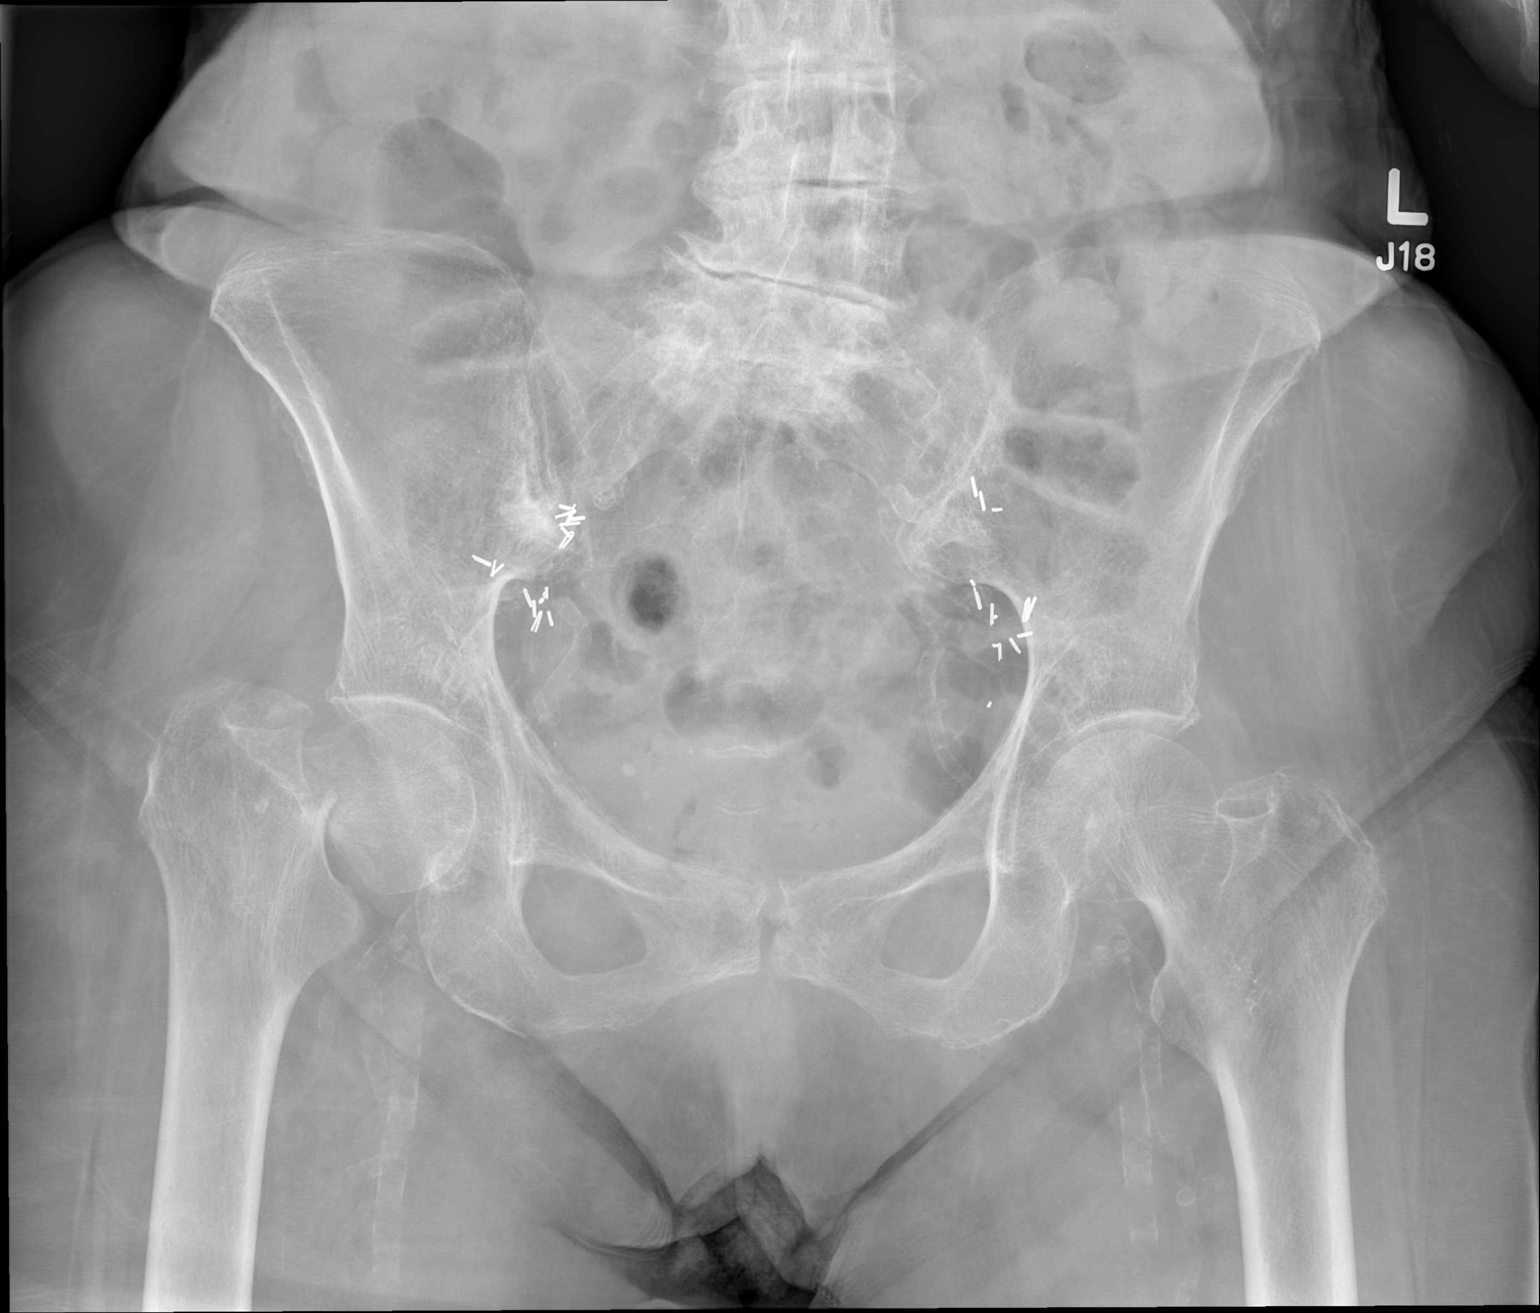

[x hip ap right]
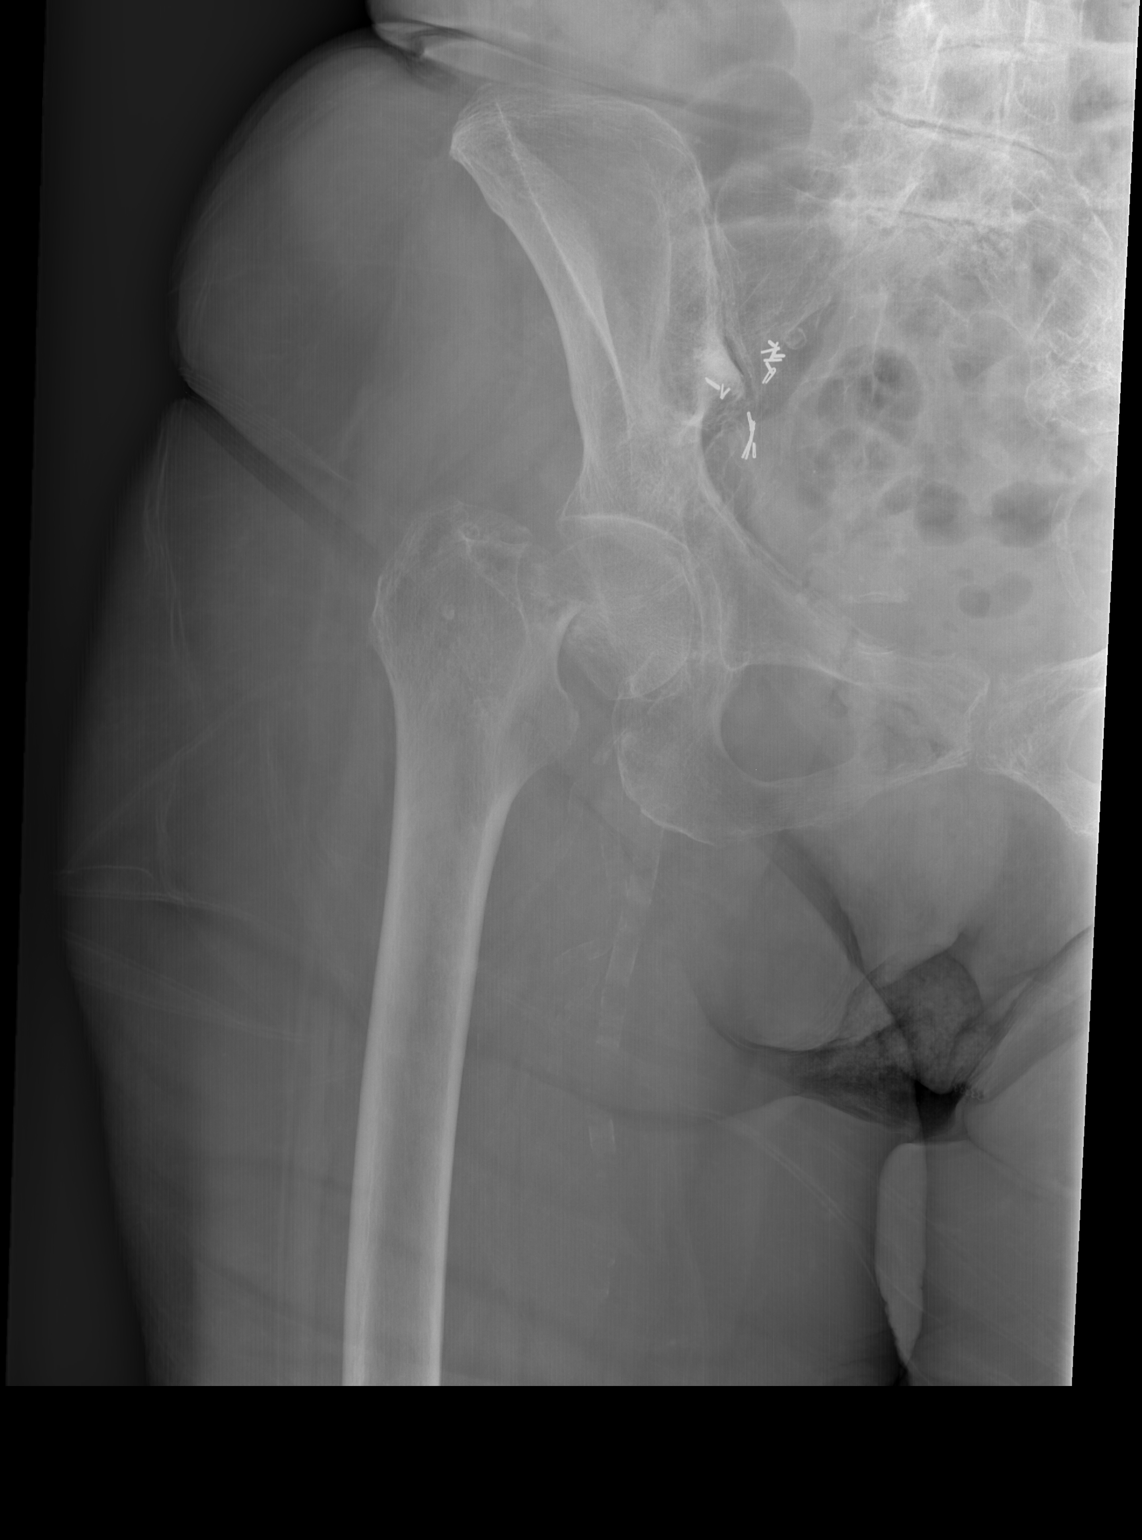

[w hip lat right]
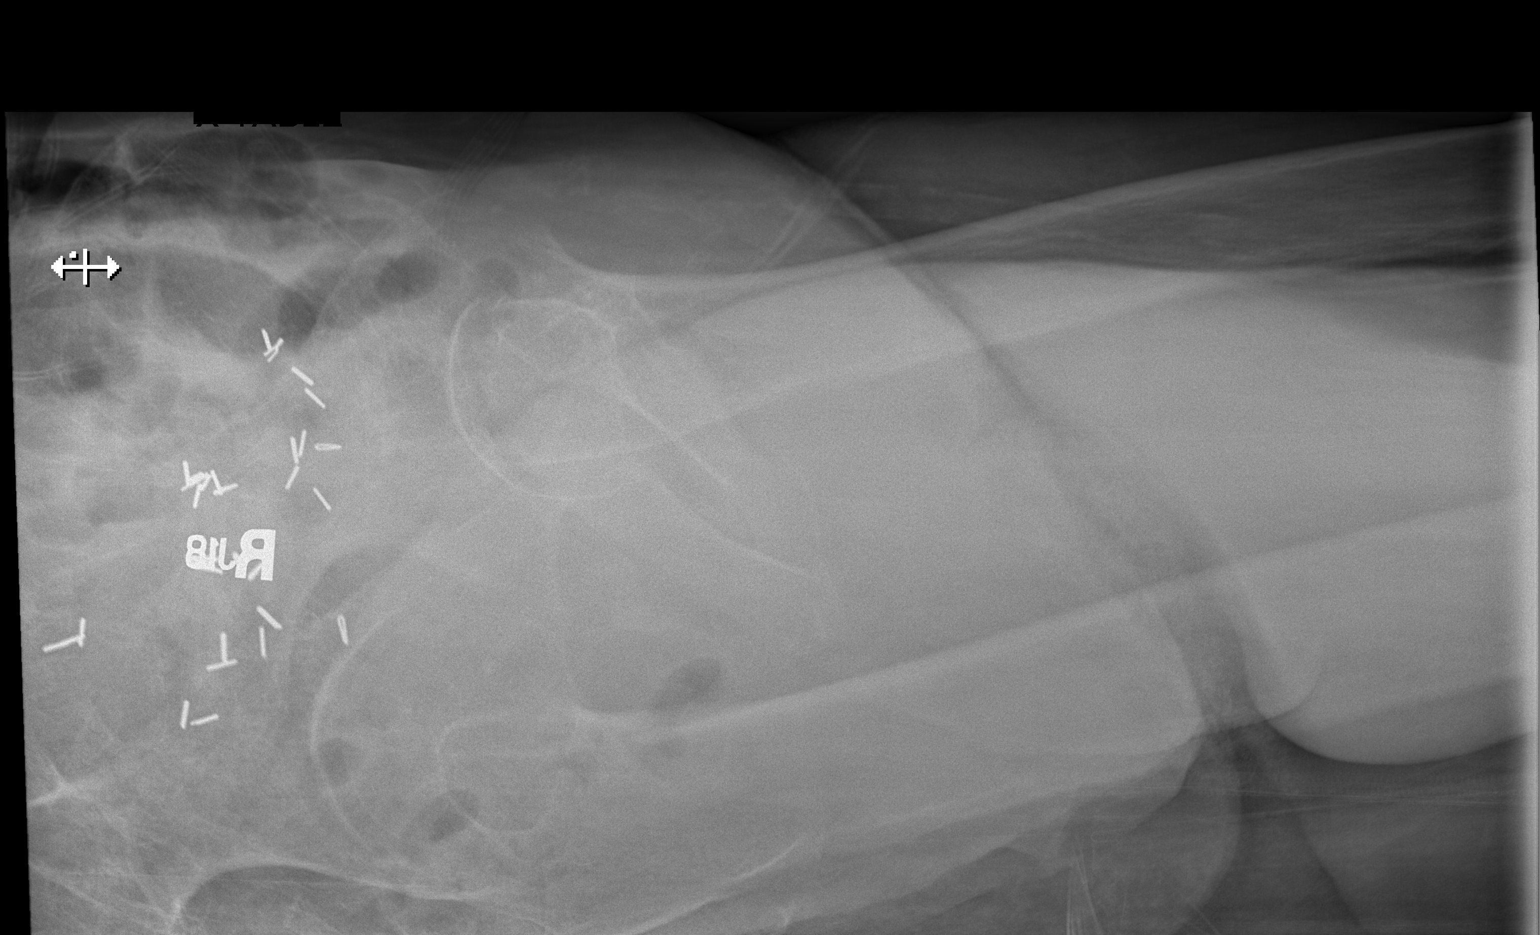

[3 of 3 positions shown; findings below may reference images not displayed]

FINDINGS: Moderately displaced proximal right femoral neck fracture is noted.
This appears to be closed and posttraumatic. No other fracture is
noted. Vascular calcifications are noted.
IMPRESSION: Moderately displaced proximal right femoral neck fracture.

## 2017-07-31 IMAGING — RF DG HIP (WITH PELVIS) OPERATIVE*R*
1 series · 3 of 3 positions shown · non-contrast
Comparison: Right hip radiographs dated 04/18/2016

CLINICAL DATA: Right hip arthroplasty anterior approach

EXAM:
OPERATIVE RIGHT HIP (WITH PELVIS IF PERFORMED) 3 VIEWS
TECHNIQUE: Fluoroscopic spot image(s) were submitted for interpretation
post-operatively.
FLUOROSCOPY TIME:  17 seconds

[Series 1: run · 3 of 3 slices shown]
[im 1/3]
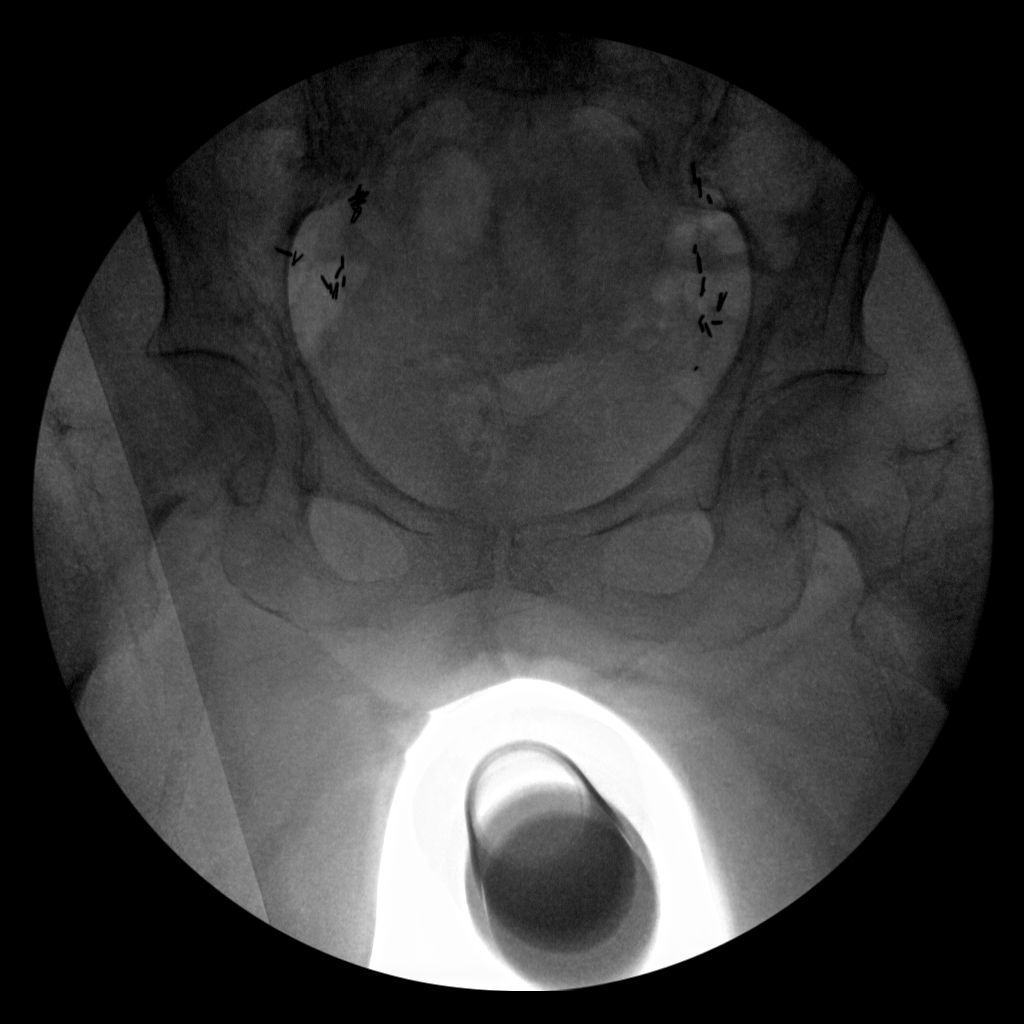
[im 2/3]
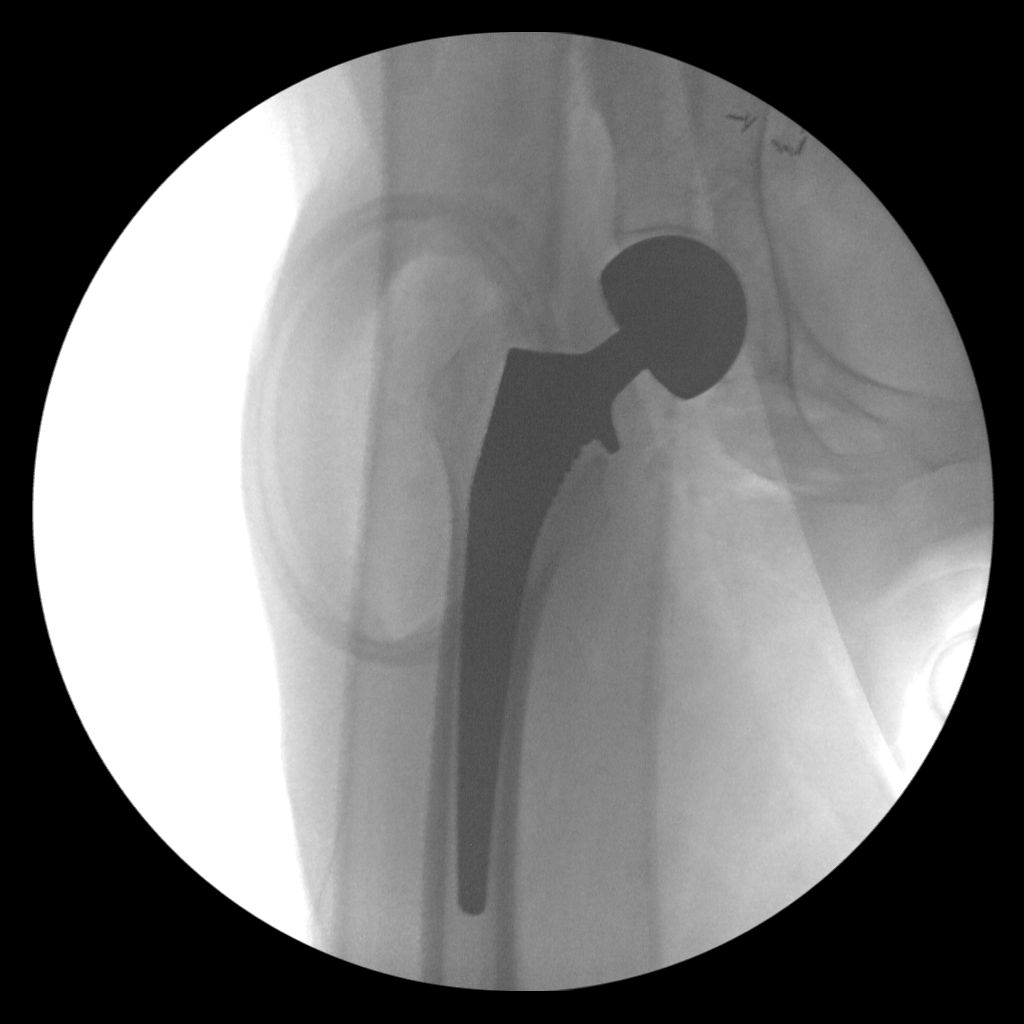
[im 3/3]
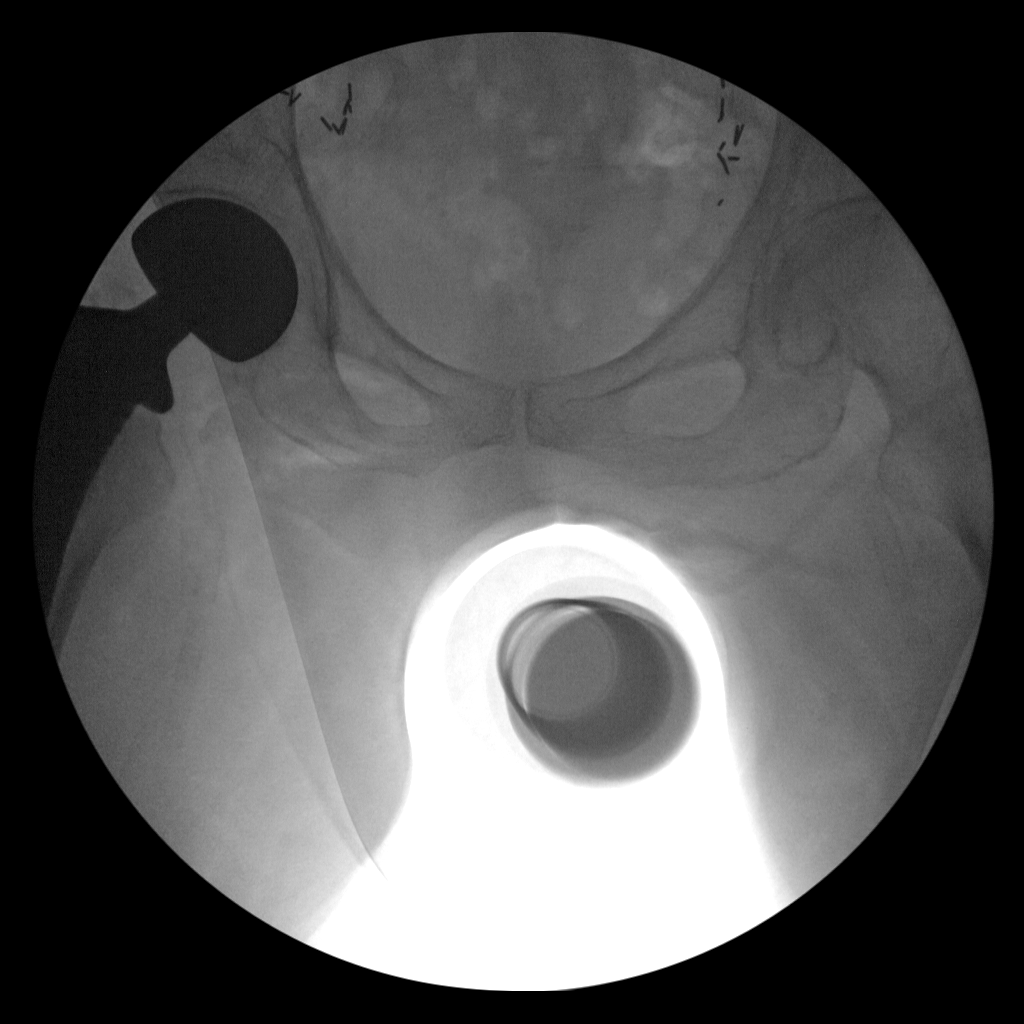

[3 of 3 positions shown; findings below may reference images not displayed]

FINDINGS: Intraoperative fluoroscopic images during right hip arthroplasty
demonstrate a right hip prosthesis in satisfactory position.
IMPRESSION: Intraoperative fluoroscopic images during right hip arthroplasty.

## 2019-07-22 DEATH — deceased
